# Patient Record
Sex: Female | Born: 1945
Health system: Southern US, Community
[De-identification: ages and names within clinical notes are randomized; demographics above are authoritative.]

## PROBLEM LIST (undated history)

## (undated) DIAGNOSIS — F209 Schizophrenia, unspecified: Secondary | ICD-10-CM

---

## 2014-03-16 DIAGNOSIS — M79604 Pain in right leg: Secondary | ICD-10-CM | POA: Diagnosis not present

## 2014-03-16 DIAGNOSIS — E1165 Type 2 diabetes mellitus with hyperglycemia: Secondary | ICD-10-CM | POA: Diagnosis not present

## 2014-03-16 DIAGNOSIS — D649 Anemia, unspecified: Secondary | ICD-10-CM | POA: Diagnosis not present

## 2014-03-16 DIAGNOSIS — R269 Unspecified abnormalities of gait and mobility: Secondary | ICD-10-CM | POA: Diagnosis not present

## 2014-03-21 DIAGNOSIS — F209 Schizophrenia, unspecified: Secondary | ICD-10-CM | POA: Diagnosis not present

## 2014-03-21 DIAGNOSIS — K219 Gastro-esophageal reflux disease without esophagitis: Secondary | ICD-10-CM | POA: Diagnosis not present

## 2014-03-21 DIAGNOSIS — E538 Deficiency of other specified B group vitamins: Secondary | ICD-10-CM | POA: Diagnosis not present

## 2014-03-21 DIAGNOSIS — Z9181 History of falling: Secondary | ICD-10-CM | POA: Diagnosis not present

## 2014-03-21 DIAGNOSIS — F0390 Unspecified dementia without behavioral disturbance: Secondary | ICD-10-CM | POA: Diagnosis not present

## 2014-03-21 DIAGNOSIS — M6281 Muscle weakness (generalized): Secondary | ICD-10-CM | POA: Diagnosis not present

## 2014-03-21 DIAGNOSIS — F4310 Post-traumatic stress disorder, unspecified: Secondary | ICD-10-CM | POA: Diagnosis not present

## 2014-03-21 DIAGNOSIS — J449 Chronic obstructive pulmonary disease, unspecified: Secondary | ICD-10-CM | POA: Diagnosis not present

## 2014-03-21 DIAGNOSIS — E119 Type 2 diabetes mellitus without complications: Secondary | ICD-10-CM | POA: Diagnosis not present

## 2014-03-22 DIAGNOSIS — F209 Schizophrenia, unspecified: Secondary | ICD-10-CM | POA: Diagnosis not present

## 2014-03-22 DIAGNOSIS — E119 Type 2 diabetes mellitus without complications: Secondary | ICD-10-CM | POA: Diagnosis not present

## 2014-03-22 DIAGNOSIS — J449 Chronic obstructive pulmonary disease, unspecified: Secondary | ICD-10-CM | POA: Diagnosis not present

## 2014-03-22 DIAGNOSIS — F4310 Post-traumatic stress disorder, unspecified: Secondary | ICD-10-CM | POA: Diagnosis not present

## 2014-03-22 DIAGNOSIS — F0390 Unspecified dementia without behavioral disturbance: Secondary | ICD-10-CM | POA: Diagnosis not present

## 2014-03-22 DIAGNOSIS — M6281 Muscle weakness (generalized): Secondary | ICD-10-CM | POA: Diagnosis not present

## 2014-03-26 DIAGNOSIS — F250 Schizoaffective disorder, bipolar type: Secondary | ICD-10-CM | POA: Diagnosis not present

## 2014-03-28 DIAGNOSIS — F0390 Unspecified dementia without behavioral disturbance: Secondary | ICD-10-CM | POA: Diagnosis not present

## 2014-03-28 DIAGNOSIS — F4310 Post-traumatic stress disorder, unspecified: Secondary | ICD-10-CM | POA: Diagnosis not present

## 2014-03-28 DIAGNOSIS — F209 Schizophrenia, unspecified: Secondary | ICD-10-CM | POA: Diagnosis not present

## 2014-03-28 DIAGNOSIS — J449 Chronic obstructive pulmonary disease, unspecified: Secondary | ICD-10-CM | POA: Diagnosis not present

## 2014-03-28 DIAGNOSIS — E119 Type 2 diabetes mellitus without complications: Secondary | ICD-10-CM | POA: Diagnosis not present

## 2014-03-28 DIAGNOSIS — M6281 Muscle weakness (generalized): Secondary | ICD-10-CM | POA: Diagnosis not present

## 2014-03-29 DIAGNOSIS — F209 Schizophrenia, unspecified: Secondary | ICD-10-CM | POA: Diagnosis not present

## 2014-03-29 DIAGNOSIS — J449 Chronic obstructive pulmonary disease, unspecified: Secondary | ICD-10-CM | POA: Diagnosis not present

## 2014-03-29 DIAGNOSIS — F4310 Post-traumatic stress disorder, unspecified: Secondary | ICD-10-CM | POA: Diagnosis not present

## 2014-03-29 DIAGNOSIS — E119 Type 2 diabetes mellitus without complications: Secondary | ICD-10-CM | POA: Diagnosis not present

## 2014-03-29 DIAGNOSIS — M6281 Muscle weakness (generalized): Secondary | ICD-10-CM | POA: Diagnosis not present

## 2014-03-29 DIAGNOSIS — F0390 Unspecified dementia without behavioral disturbance: Secondary | ICD-10-CM | POA: Diagnosis not present

## 2014-03-30 DIAGNOSIS — J449 Chronic obstructive pulmonary disease, unspecified: Secondary | ICD-10-CM | POA: Diagnosis not present

## 2014-03-30 DIAGNOSIS — E119 Type 2 diabetes mellitus without complications: Secondary | ICD-10-CM | POA: Diagnosis not present

## 2014-03-30 DIAGNOSIS — F4310 Post-traumatic stress disorder, unspecified: Secondary | ICD-10-CM | POA: Diagnosis not present

## 2014-03-30 DIAGNOSIS — F0390 Unspecified dementia without behavioral disturbance: Secondary | ICD-10-CM | POA: Diagnosis not present

## 2014-03-30 DIAGNOSIS — M6281 Muscle weakness (generalized): Secondary | ICD-10-CM | POA: Diagnosis not present

## 2014-03-30 DIAGNOSIS — F209 Schizophrenia, unspecified: Secondary | ICD-10-CM | POA: Diagnosis not present

## 2014-04-02 DIAGNOSIS — F209 Schizophrenia, unspecified: Secondary | ICD-10-CM | POA: Diagnosis not present

## 2014-04-02 DIAGNOSIS — F0390 Unspecified dementia without behavioral disturbance: Secondary | ICD-10-CM | POA: Diagnosis not present

## 2014-04-02 DIAGNOSIS — F4310 Post-traumatic stress disorder, unspecified: Secondary | ICD-10-CM | POA: Diagnosis not present

## 2014-04-02 DIAGNOSIS — E119 Type 2 diabetes mellitus without complications: Secondary | ICD-10-CM | POA: Diagnosis not present

## 2014-04-02 DIAGNOSIS — M6281 Muscle weakness (generalized): Secondary | ICD-10-CM | POA: Diagnosis not present

## 2014-04-02 DIAGNOSIS — J449 Chronic obstructive pulmonary disease, unspecified: Secondary | ICD-10-CM | POA: Diagnosis not present

## 2014-04-03 DIAGNOSIS — M6281 Muscle weakness (generalized): Secondary | ICD-10-CM | POA: Diagnosis not present

## 2014-04-03 DIAGNOSIS — F209 Schizophrenia, unspecified: Secondary | ICD-10-CM | POA: Diagnosis not present

## 2014-04-03 DIAGNOSIS — F4310 Post-traumatic stress disorder, unspecified: Secondary | ICD-10-CM | POA: Diagnosis not present

## 2014-04-03 DIAGNOSIS — J449 Chronic obstructive pulmonary disease, unspecified: Secondary | ICD-10-CM | POA: Diagnosis not present

## 2014-04-03 DIAGNOSIS — F0390 Unspecified dementia without behavioral disturbance: Secondary | ICD-10-CM | POA: Diagnosis not present

## 2014-04-03 DIAGNOSIS — E119 Type 2 diabetes mellitus without complications: Secondary | ICD-10-CM | POA: Diagnosis not present

## 2014-04-05 DIAGNOSIS — F4310 Post-traumatic stress disorder, unspecified: Secondary | ICD-10-CM | POA: Diagnosis not present

## 2014-04-05 DIAGNOSIS — F0390 Unspecified dementia without behavioral disturbance: Secondary | ICD-10-CM | POA: Diagnosis not present

## 2014-04-05 DIAGNOSIS — F209 Schizophrenia, unspecified: Secondary | ICD-10-CM | POA: Diagnosis not present

## 2014-04-05 DIAGNOSIS — M6281 Muscle weakness (generalized): Secondary | ICD-10-CM | POA: Diagnosis not present

## 2014-04-05 DIAGNOSIS — E119 Type 2 diabetes mellitus without complications: Secondary | ICD-10-CM | POA: Diagnosis not present

## 2014-04-05 DIAGNOSIS — J449 Chronic obstructive pulmonary disease, unspecified: Secondary | ICD-10-CM | POA: Diagnosis not present

## 2014-04-09 DIAGNOSIS — E119 Type 2 diabetes mellitus without complications: Secondary | ICD-10-CM | POA: Diagnosis not present

## 2014-04-09 DIAGNOSIS — M6281 Muscle weakness (generalized): Secondary | ICD-10-CM | POA: Diagnosis not present

## 2014-04-09 DIAGNOSIS — F0390 Unspecified dementia without behavioral disturbance: Secondary | ICD-10-CM | POA: Diagnosis not present

## 2014-04-09 DIAGNOSIS — F209 Schizophrenia, unspecified: Secondary | ICD-10-CM | POA: Diagnosis not present

## 2014-04-09 DIAGNOSIS — J449 Chronic obstructive pulmonary disease, unspecified: Secondary | ICD-10-CM | POA: Diagnosis not present

## 2014-04-09 DIAGNOSIS — F4310 Post-traumatic stress disorder, unspecified: Secondary | ICD-10-CM | POA: Diagnosis not present

## 2014-04-11 DIAGNOSIS — E119 Type 2 diabetes mellitus without complications: Secondary | ICD-10-CM | POA: Diagnosis not present

## 2014-04-11 DIAGNOSIS — J449 Chronic obstructive pulmonary disease, unspecified: Secondary | ICD-10-CM | POA: Diagnosis not present

## 2014-04-11 DIAGNOSIS — M6281 Muscle weakness (generalized): Secondary | ICD-10-CM | POA: Diagnosis not present

## 2014-04-11 DIAGNOSIS — F0390 Unspecified dementia without behavioral disturbance: Secondary | ICD-10-CM | POA: Diagnosis not present

## 2014-04-11 DIAGNOSIS — F4310 Post-traumatic stress disorder, unspecified: Secondary | ICD-10-CM | POA: Diagnosis not present

## 2014-04-11 DIAGNOSIS — F209 Schizophrenia, unspecified: Secondary | ICD-10-CM | POA: Diagnosis not present

## 2014-04-18 DIAGNOSIS — F0390 Unspecified dementia without behavioral disturbance: Secondary | ICD-10-CM | POA: Diagnosis not present

## 2014-04-18 DIAGNOSIS — M6281 Muscle weakness (generalized): Secondary | ICD-10-CM | POA: Diagnosis not present

## 2014-04-18 DIAGNOSIS — J449 Chronic obstructive pulmonary disease, unspecified: Secondary | ICD-10-CM | POA: Diagnosis not present

## 2014-04-18 DIAGNOSIS — F4310 Post-traumatic stress disorder, unspecified: Secondary | ICD-10-CM | POA: Diagnosis not present

## 2014-04-18 DIAGNOSIS — F209 Schizophrenia, unspecified: Secondary | ICD-10-CM | POA: Diagnosis not present

## 2014-04-18 DIAGNOSIS — E119 Type 2 diabetes mellitus without complications: Secondary | ICD-10-CM | POA: Diagnosis not present

## 2014-04-19 DIAGNOSIS — F4310 Post-traumatic stress disorder, unspecified: Secondary | ICD-10-CM | POA: Diagnosis not present

## 2014-04-19 DIAGNOSIS — E119 Type 2 diabetes mellitus without complications: Secondary | ICD-10-CM | POA: Diagnosis not present

## 2014-04-19 DIAGNOSIS — M6281 Muscle weakness (generalized): Secondary | ICD-10-CM | POA: Diagnosis not present

## 2014-04-19 DIAGNOSIS — F0390 Unspecified dementia without behavioral disturbance: Secondary | ICD-10-CM | POA: Diagnosis not present

## 2014-04-19 DIAGNOSIS — F209 Schizophrenia, unspecified: Secondary | ICD-10-CM | POA: Diagnosis not present

## 2014-04-19 DIAGNOSIS — J449 Chronic obstructive pulmonary disease, unspecified: Secondary | ICD-10-CM | POA: Diagnosis not present

## 2014-04-25 DIAGNOSIS — J449 Chronic obstructive pulmonary disease, unspecified: Secondary | ICD-10-CM | POA: Diagnosis not present

## 2014-04-25 DIAGNOSIS — E119 Type 2 diabetes mellitus without complications: Secondary | ICD-10-CM | POA: Diagnosis not present

## 2014-04-25 DIAGNOSIS — F4310 Post-traumatic stress disorder, unspecified: Secondary | ICD-10-CM | POA: Diagnosis not present

## 2014-04-25 DIAGNOSIS — M6281 Muscle weakness (generalized): Secondary | ICD-10-CM | POA: Diagnosis not present

## 2014-04-25 DIAGNOSIS — F0390 Unspecified dementia without behavioral disturbance: Secondary | ICD-10-CM | POA: Diagnosis not present

## 2014-04-25 DIAGNOSIS — F209 Schizophrenia, unspecified: Secondary | ICD-10-CM | POA: Diagnosis not present

## 2014-05-07 DIAGNOSIS — Z23 Encounter for immunization: Secondary | ICD-10-CM | POA: Diagnosis not present

## 2014-05-09 DIAGNOSIS — L609 Nail disorder, unspecified: Secondary | ICD-10-CM | POA: Diagnosis not present

## 2014-06-08 DIAGNOSIS — I10 Essential (primary) hypertension: Secondary | ICD-10-CM | POA: Diagnosis not present

## 2014-06-08 DIAGNOSIS — D649 Anemia, unspecified: Secondary | ICD-10-CM | POA: Diagnosis not present

## 2014-06-08 DIAGNOSIS — E1165 Type 2 diabetes mellitus with hyperglycemia: Secondary | ICD-10-CM | POA: Diagnosis not present

## 2014-06-08 DIAGNOSIS — K210 Gastro-esophageal reflux disease with esophagitis: Secondary | ICD-10-CM | POA: Diagnosis not present

## 2014-06-25 DIAGNOSIS — F250 Schizoaffective disorder, bipolar type: Secondary | ICD-10-CM | POA: Diagnosis not present

## 2014-06-28 DIAGNOSIS — F319 Bipolar disorder, unspecified: Secondary | ICD-10-CM | POA: Diagnosis not present

## 2014-06-28 DIAGNOSIS — F209 Schizophrenia, unspecified: Secondary | ICD-10-CM | POA: Diagnosis not present

## 2014-06-28 DIAGNOSIS — M6281 Muscle weakness (generalized): Secondary | ICD-10-CM | POA: Diagnosis not present

## 2014-06-28 DIAGNOSIS — Z79899 Other long term (current) drug therapy: Secondary | ICD-10-CM | POA: Diagnosis not present

## 2014-06-28 DIAGNOSIS — I10 Essential (primary) hypertension: Secondary | ICD-10-CM | POA: Diagnosis not present

## 2014-06-28 DIAGNOSIS — N390 Urinary tract infection, site not specified: Secondary | ICD-10-CM | POA: Diagnosis not present

## 2014-06-28 DIAGNOSIS — J449 Chronic obstructive pulmonary disease, unspecified: Secondary | ICD-10-CM | POA: Diagnosis not present

## 2014-06-28 DIAGNOSIS — Z9181 History of falling: Secondary | ICD-10-CM | POA: Diagnosis not present

## 2014-06-28 DIAGNOSIS — E119 Type 2 diabetes mellitus without complications: Secondary | ICD-10-CM | POA: Diagnosis not present

## 2014-06-28 DIAGNOSIS — F0390 Unspecified dementia without behavioral disturbance: Secondary | ICD-10-CM | POA: Diagnosis not present

## 2014-06-28 DIAGNOSIS — R269 Unspecified abnormalities of gait and mobility: Secondary | ICD-10-CM | POA: Diagnosis not present

## 2014-06-28 DIAGNOSIS — F4310 Post-traumatic stress disorder, unspecified: Secondary | ICD-10-CM | POA: Diagnosis not present

## 2014-07-02 DIAGNOSIS — F209 Schizophrenia, unspecified: Secondary | ICD-10-CM | POA: Diagnosis not present

## 2014-07-02 DIAGNOSIS — F319 Bipolar disorder, unspecified: Secondary | ICD-10-CM | POA: Diagnosis not present

## 2014-07-02 DIAGNOSIS — M6281 Muscle weakness (generalized): Secondary | ICD-10-CM | POA: Diagnosis not present

## 2014-07-02 DIAGNOSIS — F4310 Post-traumatic stress disorder, unspecified: Secondary | ICD-10-CM | POA: Diagnosis not present

## 2014-07-02 DIAGNOSIS — I10 Essential (primary) hypertension: Secondary | ICD-10-CM | POA: Diagnosis not present

## 2014-07-02 DIAGNOSIS — E119 Type 2 diabetes mellitus without complications: Secondary | ICD-10-CM | POA: Diagnosis not present

## 2014-07-03 DIAGNOSIS — E119 Type 2 diabetes mellitus without complications: Secondary | ICD-10-CM | POA: Diagnosis not present

## 2014-07-03 DIAGNOSIS — I10 Essential (primary) hypertension: Secondary | ICD-10-CM | POA: Diagnosis not present

## 2014-07-03 DIAGNOSIS — M6281 Muscle weakness (generalized): Secondary | ICD-10-CM | POA: Diagnosis not present

## 2014-07-03 DIAGNOSIS — F319 Bipolar disorder, unspecified: Secondary | ICD-10-CM | POA: Diagnosis not present

## 2014-07-03 DIAGNOSIS — F209 Schizophrenia, unspecified: Secondary | ICD-10-CM | POA: Diagnosis not present

## 2014-07-03 DIAGNOSIS — F4310 Post-traumatic stress disorder, unspecified: Secondary | ICD-10-CM | POA: Diagnosis not present

## 2014-07-04 DIAGNOSIS — E119 Type 2 diabetes mellitus without complications: Secondary | ICD-10-CM | POA: Diagnosis not present

## 2014-07-04 DIAGNOSIS — F4310 Post-traumatic stress disorder, unspecified: Secondary | ICD-10-CM | POA: Diagnosis not present

## 2014-07-04 DIAGNOSIS — F209 Schizophrenia, unspecified: Secondary | ICD-10-CM | POA: Diagnosis not present

## 2014-07-04 DIAGNOSIS — M6281 Muscle weakness (generalized): Secondary | ICD-10-CM | POA: Diagnosis not present

## 2014-07-04 DIAGNOSIS — F319 Bipolar disorder, unspecified: Secondary | ICD-10-CM | POA: Diagnosis not present

## 2014-07-04 DIAGNOSIS — I10 Essential (primary) hypertension: Secondary | ICD-10-CM | POA: Diagnosis not present

## 2014-07-07 DIAGNOSIS — F209 Schizophrenia, unspecified: Secondary | ICD-10-CM | POA: Diagnosis not present

## 2014-07-07 DIAGNOSIS — F319 Bipolar disorder, unspecified: Secondary | ICD-10-CM | POA: Diagnosis not present

## 2014-07-07 DIAGNOSIS — I10 Essential (primary) hypertension: Secondary | ICD-10-CM | POA: Diagnosis not present

## 2014-07-07 DIAGNOSIS — F4310 Post-traumatic stress disorder, unspecified: Secondary | ICD-10-CM | POA: Diagnosis not present

## 2014-07-07 DIAGNOSIS — M6281 Muscle weakness (generalized): Secondary | ICD-10-CM | POA: Diagnosis not present

## 2014-07-07 DIAGNOSIS — E119 Type 2 diabetes mellitus without complications: Secondary | ICD-10-CM | POA: Diagnosis not present

## 2014-07-10 DIAGNOSIS — F319 Bipolar disorder, unspecified: Secondary | ICD-10-CM | POA: Diagnosis not present

## 2014-07-10 DIAGNOSIS — M6281 Muscle weakness (generalized): Secondary | ICD-10-CM | POA: Diagnosis not present

## 2014-07-10 DIAGNOSIS — F209 Schizophrenia, unspecified: Secondary | ICD-10-CM | POA: Diagnosis not present

## 2014-07-10 DIAGNOSIS — E119 Type 2 diabetes mellitus without complications: Secondary | ICD-10-CM | POA: Diagnosis not present

## 2014-07-10 DIAGNOSIS — F4310 Post-traumatic stress disorder, unspecified: Secondary | ICD-10-CM | POA: Diagnosis not present

## 2014-07-10 DIAGNOSIS — I10 Essential (primary) hypertension: Secondary | ICD-10-CM | POA: Diagnosis not present

## 2014-07-11 DIAGNOSIS — I10 Essential (primary) hypertension: Secondary | ICD-10-CM | POA: Diagnosis not present

## 2014-07-11 DIAGNOSIS — F4310 Post-traumatic stress disorder, unspecified: Secondary | ICD-10-CM | POA: Diagnosis not present

## 2014-07-11 DIAGNOSIS — M6281 Muscle weakness (generalized): Secondary | ICD-10-CM | POA: Diagnosis not present

## 2014-07-11 DIAGNOSIS — F319 Bipolar disorder, unspecified: Secondary | ICD-10-CM | POA: Diagnosis not present

## 2014-07-11 DIAGNOSIS — E119 Type 2 diabetes mellitus without complications: Secondary | ICD-10-CM | POA: Diagnosis not present

## 2014-07-11 DIAGNOSIS — F209 Schizophrenia, unspecified: Secondary | ICD-10-CM | POA: Diagnosis not present

## 2014-07-12 DIAGNOSIS — I10 Essential (primary) hypertension: Secondary | ICD-10-CM | POA: Diagnosis not present

## 2014-07-12 DIAGNOSIS — F209 Schizophrenia, unspecified: Secondary | ICD-10-CM | POA: Diagnosis not present

## 2014-07-12 DIAGNOSIS — F4310 Post-traumatic stress disorder, unspecified: Secondary | ICD-10-CM | POA: Diagnosis not present

## 2014-07-12 DIAGNOSIS — M6281 Muscle weakness (generalized): Secondary | ICD-10-CM | POA: Diagnosis not present

## 2014-07-12 DIAGNOSIS — F319 Bipolar disorder, unspecified: Secondary | ICD-10-CM | POA: Diagnosis not present

## 2014-07-12 DIAGNOSIS — E119 Type 2 diabetes mellitus without complications: Secondary | ICD-10-CM | POA: Diagnosis not present

## 2014-07-13 DIAGNOSIS — F209 Schizophrenia, unspecified: Secondary | ICD-10-CM | POA: Diagnosis not present

## 2014-07-13 DIAGNOSIS — E119 Type 2 diabetes mellitus without complications: Secondary | ICD-10-CM | POA: Diagnosis not present

## 2014-07-13 DIAGNOSIS — F4310 Post-traumatic stress disorder, unspecified: Secondary | ICD-10-CM | POA: Diagnosis not present

## 2014-07-13 DIAGNOSIS — I10 Essential (primary) hypertension: Secondary | ICD-10-CM | POA: Diagnosis not present

## 2014-07-13 DIAGNOSIS — M6281 Muscle weakness (generalized): Secondary | ICD-10-CM | POA: Diagnosis not present

## 2014-07-13 DIAGNOSIS — F319 Bipolar disorder, unspecified: Secondary | ICD-10-CM | POA: Diagnosis not present

## 2014-07-16 DIAGNOSIS — M6281 Muscle weakness (generalized): Secondary | ICD-10-CM | POA: Diagnosis not present

## 2014-07-16 DIAGNOSIS — F209 Schizophrenia, unspecified: Secondary | ICD-10-CM | POA: Diagnosis not present

## 2014-07-16 DIAGNOSIS — F319 Bipolar disorder, unspecified: Secondary | ICD-10-CM | POA: Diagnosis not present

## 2014-07-16 DIAGNOSIS — E119 Type 2 diabetes mellitus without complications: Secondary | ICD-10-CM | POA: Diagnosis not present

## 2014-07-16 DIAGNOSIS — F4310 Post-traumatic stress disorder, unspecified: Secondary | ICD-10-CM | POA: Diagnosis not present

## 2014-07-16 DIAGNOSIS — I10 Essential (primary) hypertension: Secondary | ICD-10-CM | POA: Diagnosis not present

## 2014-07-17 DIAGNOSIS — M6281 Muscle weakness (generalized): Secondary | ICD-10-CM | POA: Diagnosis not present

## 2014-07-17 DIAGNOSIS — I10 Essential (primary) hypertension: Secondary | ICD-10-CM | POA: Diagnosis not present

## 2014-07-17 DIAGNOSIS — F4310 Post-traumatic stress disorder, unspecified: Secondary | ICD-10-CM | POA: Diagnosis not present

## 2014-07-17 DIAGNOSIS — F209 Schizophrenia, unspecified: Secondary | ICD-10-CM | POA: Diagnosis not present

## 2014-07-17 DIAGNOSIS — F319 Bipolar disorder, unspecified: Secondary | ICD-10-CM | POA: Diagnosis not present

## 2014-07-17 DIAGNOSIS — E119 Type 2 diabetes mellitus without complications: Secondary | ICD-10-CM | POA: Diagnosis not present

## 2014-07-18 DIAGNOSIS — F4310 Post-traumatic stress disorder, unspecified: Secondary | ICD-10-CM | POA: Diagnosis not present

## 2014-07-18 DIAGNOSIS — M6281 Muscle weakness (generalized): Secondary | ICD-10-CM | POA: Diagnosis not present

## 2014-07-18 DIAGNOSIS — E119 Type 2 diabetes mellitus without complications: Secondary | ICD-10-CM | POA: Diagnosis not present

## 2014-07-18 DIAGNOSIS — F209 Schizophrenia, unspecified: Secondary | ICD-10-CM | POA: Diagnosis not present

## 2014-07-18 DIAGNOSIS — F319 Bipolar disorder, unspecified: Secondary | ICD-10-CM | POA: Diagnosis not present

## 2014-07-18 DIAGNOSIS — I10 Essential (primary) hypertension: Secondary | ICD-10-CM | POA: Diagnosis not present

## 2014-07-23 DIAGNOSIS — M6281 Muscle weakness (generalized): Secondary | ICD-10-CM | POA: Diagnosis not present

## 2014-07-23 DIAGNOSIS — I10 Essential (primary) hypertension: Secondary | ICD-10-CM | POA: Diagnosis not present

## 2014-07-23 DIAGNOSIS — F319 Bipolar disorder, unspecified: Secondary | ICD-10-CM | POA: Diagnosis not present

## 2014-07-23 DIAGNOSIS — E119 Type 2 diabetes mellitus without complications: Secondary | ICD-10-CM | POA: Diagnosis not present

## 2014-07-23 DIAGNOSIS — F4310 Post-traumatic stress disorder, unspecified: Secondary | ICD-10-CM | POA: Diagnosis not present

## 2014-07-23 DIAGNOSIS — F209 Schizophrenia, unspecified: Secondary | ICD-10-CM | POA: Diagnosis not present

## 2014-07-24 DIAGNOSIS — F4310 Post-traumatic stress disorder, unspecified: Secondary | ICD-10-CM | POA: Diagnosis not present

## 2014-07-24 DIAGNOSIS — I10 Essential (primary) hypertension: Secondary | ICD-10-CM | POA: Diagnosis not present

## 2014-07-24 DIAGNOSIS — M6281 Muscle weakness (generalized): Secondary | ICD-10-CM | POA: Diagnosis not present

## 2014-07-24 DIAGNOSIS — F319 Bipolar disorder, unspecified: Secondary | ICD-10-CM | POA: Diagnosis not present

## 2014-07-24 DIAGNOSIS — F209 Schizophrenia, unspecified: Secondary | ICD-10-CM | POA: Diagnosis not present

## 2014-07-24 DIAGNOSIS — E119 Type 2 diabetes mellitus without complications: Secondary | ICD-10-CM | POA: Diagnosis not present

## 2014-07-25 DIAGNOSIS — M6281 Muscle weakness (generalized): Secondary | ICD-10-CM | POA: Diagnosis not present

## 2014-07-25 DIAGNOSIS — F319 Bipolar disorder, unspecified: Secondary | ICD-10-CM | POA: Diagnosis not present

## 2014-07-25 DIAGNOSIS — F4310 Post-traumatic stress disorder, unspecified: Secondary | ICD-10-CM | POA: Diagnosis not present

## 2014-07-25 DIAGNOSIS — F209 Schizophrenia, unspecified: Secondary | ICD-10-CM | POA: Diagnosis not present

## 2014-07-25 DIAGNOSIS — E119 Type 2 diabetes mellitus without complications: Secondary | ICD-10-CM | POA: Diagnosis not present

## 2014-07-25 DIAGNOSIS — I10 Essential (primary) hypertension: Secondary | ICD-10-CM | POA: Diagnosis not present

## 2014-07-31 DIAGNOSIS — F4310 Post-traumatic stress disorder, unspecified: Secondary | ICD-10-CM | POA: Diagnosis not present

## 2014-07-31 DIAGNOSIS — F209 Schizophrenia, unspecified: Secondary | ICD-10-CM | POA: Diagnosis not present

## 2014-07-31 DIAGNOSIS — I10 Essential (primary) hypertension: Secondary | ICD-10-CM | POA: Diagnosis not present

## 2014-07-31 DIAGNOSIS — E119 Type 2 diabetes mellitus without complications: Secondary | ICD-10-CM | POA: Diagnosis not present

## 2014-07-31 DIAGNOSIS — M6281 Muscle weakness (generalized): Secondary | ICD-10-CM | POA: Diagnosis not present

## 2014-07-31 DIAGNOSIS — F319 Bipolar disorder, unspecified: Secondary | ICD-10-CM | POA: Diagnosis not present

## 2014-08-24 DIAGNOSIS — E119 Type 2 diabetes mellitus without complications: Secondary | ICD-10-CM | POA: Diagnosis not present

## 2014-08-24 DIAGNOSIS — F209 Schizophrenia, unspecified: Secondary | ICD-10-CM | POA: Diagnosis not present

## 2014-08-24 DIAGNOSIS — I10 Essential (primary) hypertension: Secondary | ICD-10-CM | POA: Diagnosis not present

## 2014-08-24 DIAGNOSIS — M6281 Muscle weakness (generalized): Secondary | ICD-10-CM | POA: Diagnosis not present

## 2014-09-24 DIAGNOSIS — E039 Hypothyroidism, unspecified: Secondary | ICD-10-CM | POA: Diagnosis not present

## 2014-09-24 DIAGNOSIS — I10 Essential (primary) hypertension: Secondary | ICD-10-CM | POA: Diagnosis not present

## 2014-09-24 DIAGNOSIS — E785 Hyperlipidemia, unspecified: Secondary | ICD-10-CM | POA: Diagnosis not present

## 2014-09-24 DIAGNOSIS — E119 Type 2 diabetes mellitus without complications: Secondary | ICD-10-CM | POA: Diagnosis not present

## 2014-09-25 DIAGNOSIS — F250 Schizoaffective disorder, bipolar type: Secondary | ICD-10-CM | POA: Diagnosis not present

## 2014-10-05 DIAGNOSIS — I10 Essential (primary) hypertension: Secondary | ICD-10-CM | POA: Diagnosis not present

## 2014-10-05 DIAGNOSIS — F0280 Dementia in other diseases classified elsewhere without behavioral disturbance: Secondary | ICD-10-CM | POA: Diagnosis not present

## 2014-10-05 DIAGNOSIS — D649 Anemia, unspecified: Secondary | ICD-10-CM | POA: Diagnosis not present

## 2014-10-05 DIAGNOSIS — G309 Alzheimer's disease, unspecified: Secondary | ICD-10-CM | POA: Diagnosis not present

## 2014-10-05 DIAGNOSIS — Z9181 History of falling: Secondary | ICD-10-CM | POA: Diagnosis not present

## 2014-10-05 DIAGNOSIS — F209 Schizophrenia, unspecified: Secondary | ICD-10-CM | POA: Diagnosis not present

## 2014-10-05 DIAGNOSIS — E1142 Type 2 diabetes mellitus with diabetic polyneuropathy: Secondary | ICD-10-CM | POA: Diagnosis not present

## 2014-10-10 DIAGNOSIS — D649 Anemia, unspecified: Secondary | ICD-10-CM | POA: Diagnosis not present

## 2014-10-10 DIAGNOSIS — F209 Schizophrenia, unspecified: Secondary | ICD-10-CM | POA: Diagnosis not present

## 2014-10-10 DIAGNOSIS — I10 Essential (primary) hypertension: Secondary | ICD-10-CM | POA: Diagnosis not present

## 2014-10-10 DIAGNOSIS — F0280 Dementia in other diseases classified elsewhere without behavioral disturbance: Secondary | ICD-10-CM | POA: Diagnosis not present

## 2014-10-10 DIAGNOSIS — E1142 Type 2 diabetes mellitus with diabetic polyneuropathy: Secondary | ICD-10-CM | POA: Diagnosis not present

## 2014-10-10 DIAGNOSIS — G309 Alzheimer's disease, unspecified: Secondary | ICD-10-CM | POA: Diagnosis not present

## 2014-10-11 DIAGNOSIS — I10 Essential (primary) hypertension: Secondary | ICD-10-CM | POA: Diagnosis not present

## 2014-10-11 DIAGNOSIS — F0280 Dementia in other diseases classified elsewhere without behavioral disturbance: Secondary | ICD-10-CM | POA: Diagnosis not present

## 2014-10-11 DIAGNOSIS — E1142 Type 2 diabetes mellitus with diabetic polyneuropathy: Secondary | ICD-10-CM | POA: Diagnosis not present

## 2014-10-11 DIAGNOSIS — G309 Alzheimer's disease, unspecified: Secondary | ICD-10-CM | POA: Diagnosis not present

## 2014-10-11 DIAGNOSIS — F209 Schizophrenia, unspecified: Secondary | ICD-10-CM | POA: Diagnosis not present

## 2014-10-11 DIAGNOSIS — D649 Anemia, unspecified: Secondary | ICD-10-CM | POA: Diagnosis not present

## 2014-10-16 DIAGNOSIS — D649 Anemia, unspecified: Secondary | ICD-10-CM | POA: Diagnosis not present

## 2014-10-16 DIAGNOSIS — I10 Essential (primary) hypertension: Secondary | ICD-10-CM | POA: Diagnosis not present

## 2014-10-16 DIAGNOSIS — E1142 Type 2 diabetes mellitus with diabetic polyneuropathy: Secondary | ICD-10-CM | POA: Diagnosis not present

## 2014-10-16 DIAGNOSIS — G309 Alzheimer's disease, unspecified: Secondary | ICD-10-CM | POA: Diagnosis not present

## 2014-10-16 DIAGNOSIS — F0280 Dementia in other diseases classified elsewhere without behavioral disturbance: Secondary | ICD-10-CM | POA: Diagnosis not present

## 2014-10-16 DIAGNOSIS — F209 Schizophrenia, unspecified: Secondary | ICD-10-CM | POA: Diagnosis not present

## 2014-10-18 DIAGNOSIS — F209 Schizophrenia, unspecified: Secondary | ICD-10-CM | POA: Diagnosis not present

## 2014-10-18 DIAGNOSIS — G309 Alzheimer's disease, unspecified: Secondary | ICD-10-CM | POA: Diagnosis not present

## 2014-10-18 DIAGNOSIS — D649 Anemia, unspecified: Secondary | ICD-10-CM | POA: Diagnosis not present

## 2014-10-18 DIAGNOSIS — E1142 Type 2 diabetes mellitus with diabetic polyneuropathy: Secondary | ICD-10-CM | POA: Diagnosis not present

## 2014-10-18 DIAGNOSIS — F0280 Dementia in other diseases classified elsewhere without behavioral disturbance: Secondary | ICD-10-CM | POA: Diagnosis not present

## 2014-10-18 DIAGNOSIS — I10 Essential (primary) hypertension: Secondary | ICD-10-CM | POA: Diagnosis not present

## 2014-10-22 DIAGNOSIS — F0280 Dementia in other diseases classified elsewhere without behavioral disturbance: Secondary | ICD-10-CM | POA: Diagnosis not present

## 2014-10-22 DIAGNOSIS — E1142 Type 2 diabetes mellitus with diabetic polyneuropathy: Secondary | ICD-10-CM | POA: Diagnosis not present

## 2014-10-22 DIAGNOSIS — F209 Schizophrenia, unspecified: Secondary | ICD-10-CM | POA: Diagnosis not present

## 2014-10-22 DIAGNOSIS — D649 Anemia, unspecified: Secondary | ICD-10-CM | POA: Diagnosis not present

## 2014-10-22 DIAGNOSIS — G309 Alzheimer's disease, unspecified: Secondary | ICD-10-CM | POA: Diagnosis not present

## 2014-10-22 DIAGNOSIS — I10 Essential (primary) hypertension: Secondary | ICD-10-CM | POA: Diagnosis not present

## 2014-11-02 DIAGNOSIS — F209 Schizophrenia, unspecified: Secondary | ICD-10-CM | POA: Diagnosis not present

## 2014-11-02 DIAGNOSIS — F0280 Dementia in other diseases classified elsewhere without behavioral disturbance: Secondary | ICD-10-CM | POA: Diagnosis not present

## 2014-11-02 DIAGNOSIS — G309 Alzheimer's disease, unspecified: Secondary | ICD-10-CM | POA: Diagnosis not present

## 2014-11-02 DIAGNOSIS — I10 Essential (primary) hypertension: Secondary | ICD-10-CM | POA: Diagnosis not present

## 2014-11-02 DIAGNOSIS — D649 Anemia, unspecified: Secondary | ICD-10-CM | POA: Diagnosis not present

## 2014-11-02 DIAGNOSIS — E1142 Type 2 diabetes mellitus with diabetic polyneuropathy: Secondary | ICD-10-CM | POA: Diagnosis not present

## 2014-11-08 DIAGNOSIS — F209 Schizophrenia, unspecified: Secondary | ICD-10-CM | POA: Diagnosis not present

## 2014-11-08 DIAGNOSIS — G309 Alzheimer's disease, unspecified: Secondary | ICD-10-CM | POA: Diagnosis not present

## 2014-11-08 DIAGNOSIS — F0280 Dementia in other diseases classified elsewhere without behavioral disturbance: Secondary | ICD-10-CM | POA: Diagnosis not present

## 2014-11-08 DIAGNOSIS — I10 Essential (primary) hypertension: Secondary | ICD-10-CM | POA: Diagnosis not present

## 2014-11-08 DIAGNOSIS — E1142 Type 2 diabetes mellitus with diabetic polyneuropathy: Secondary | ICD-10-CM | POA: Diagnosis not present

## 2014-11-08 DIAGNOSIS — D649 Anemia, unspecified: Secondary | ICD-10-CM | POA: Diagnosis not present

## 2014-11-13 DIAGNOSIS — E1142 Type 2 diabetes mellitus with diabetic polyneuropathy: Secondary | ICD-10-CM | POA: Diagnosis not present

## 2014-11-13 DIAGNOSIS — F209 Schizophrenia, unspecified: Secondary | ICD-10-CM | POA: Diagnosis not present

## 2014-11-13 DIAGNOSIS — G309 Alzheimer's disease, unspecified: Secondary | ICD-10-CM | POA: Diagnosis not present

## 2014-11-13 DIAGNOSIS — I10 Essential (primary) hypertension: Secondary | ICD-10-CM | POA: Diagnosis not present

## 2014-11-13 DIAGNOSIS — F0280 Dementia in other diseases classified elsewhere without behavioral disturbance: Secondary | ICD-10-CM | POA: Diagnosis not present

## 2014-11-13 DIAGNOSIS — D649 Anemia, unspecified: Secondary | ICD-10-CM | POA: Diagnosis not present

## 2014-11-22 DIAGNOSIS — F0280 Dementia in other diseases classified elsewhere without behavioral disturbance: Secondary | ICD-10-CM | POA: Diagnosis not present

## 2014-11-22 DIAGNOSIS — D649 Anemia, unspecified: Secondary | ICD-10-CM | POA: Diagnosis not present

## 2014-11-22 DIAGNOSIS — E039 Hypothyroidism, unspecified: Secondary | ICD-10-CM | POA: Diagnosis not present

## 2014-11-22 DIAGNOSIS — G309 Alzheimer's disease, unspecified: Secondary | ICD-10-CM | POA: Diagnosis not present

## 2014-11-22 DIAGNOSIS — E789 Disorder of lipoprotein metabolism, unspecified: Secondary | ICD-10-CM | POA: Diagnosis not present

## 2014-11-22 DIAGNOSIS — E1142 Type 2 diabetes mellitus with diabetic polyneuropathy: Secondary | ICD-10-CM | POA: Diagnosis not present

## 2014-11-22 DIAGNOSIS — I10 Essential (primary) hypertension: Secondary | ICD-10-CM | POA: Diagnosis not present

## 2014-11-22 DIAGNOSIS — F209 Schizophrenia, unspecified: Secondary | ICD-10-CM | POA: Diagnosis not present

## 2014-11-23 DIAGNOSIS — Z23 Encounter for immunization: Secondary | ICD-10-CM | POA: Diagnosis not present

## 2014-11-27 DIAGNOSIS — G309 Alzheimer's disease, unspecified: Secondary | ICD-10-CM | POA: Diagnosis not present

## 2014-11-27 DIAGNOSIS — I10 Essential (primary) hypertension: Secondary | ICD-10-CM | POA: Diagnosis not present

## 2014-11-27 DIAGNOSIS — E1142 Type 2 diabetes mellitus with diabetic polyneuropathy: Secondary | ICD-10-CM | POA: Diagnosis not present

## 2014-11-27 DIAGNOSIS — F0280 Dementia in other diseases classified elsewhere without behavioral disturbance: Secondary | ICD-10-CM | POA: Diagnosis not present

## 2014-11-27 DIAGNOSIS — F209 Schizophrenia, unspecified: Secondary | ICD-10-CM | POA: Diagnosis not present

## 2014-11-27 DIAGNOSIS — D649 Anemia, unspecified: Secondary | ICD-10-CM | POA: Diagnosis not present

## 2014-12-21 DIAGNOSIS — F250 Schizoaffective disorder, bipolar type: Secondary | ICD-10-CM | POA: Diagnosis not present

## 2015-01-30 DIAGNOSIS — F0390 Unspecified dementia without behavioral disturbance: Secondary | ICD-10-CM | POA: Diagnosis not present

## 2015-01-30 DIAGNOSIS — G461 Anterior cerebral artery syndrome: Secondary | ICD-10-CM | POA: Diagnosis not present

## 2015-01-30 DIAGNOSIS — G4701 Insomnia due to medical condition: Secondary | ICD-10-CM | POA: Diagnosis not present

## 2015-01-30 DIAGNOSIS — F419 Anxiety disorder, unspecified: Secondary | ICD-10-CM | POA: Diagnosis not present

## 2015-01-30 DIAGNOSIS — I10 Essential (primary) hypertension: Secondary | ICD-10-CM | POA: Diagnosis not present

## 2015-01-30 DIAGNOSIS — F209 Schizophrenia, unspecified: Secondary | ICD-10-CM | POA: Diagnosis not present

## 2015-01-30 DIAGNOSIS — Z0000 Encounter for general adult medical examination without abnormal findings: Secondary | ICD-10-CM | POA: Diagnosis not present

## 2015-01-30 DIAGNOSIS — E119 Type 2 diabetes mellitus without complications: Secondary | ICD-10-CM | POA: Diagnosis not present

## 2015-03-22 DIAGNOSIS — F250 Schizoaffective disorder, bipolar type: Secondary | ICD-10-CM | POA: Diagnosis not present

## 2015-04-15 DIAGNOSIS — Z9181 History of falling: Secondary | ICD-10-CM | POA: Diagnosis not present

## 2015-04-15 DIAGNOSIS — R269 Unspecified abnormalities of gait and mobility: Secondary | ICD-10-CM | POA: Diagnosis not present

## 2015-04-15 DIAGNOSIS — E538 Deficiency of other specified B group vitamins: Secondary | ICD-10-CM | POA: Diagnosis not present

## 2015-04-15 DIAGNOSIS — F209 Schizophrenia, unspecified: Secondary | ICD-10-CM | POA: Diagnosis not present

## 2015-04-15 DIAGNOSIS — Z79899 Other long term (current) drug therapy: Secondary | ICD-10-CM | POA: Diagnosis not present

## 2015-04-15 DIAGNOSIS — E119 Type 2 diabetes mellitus without complications: Secondary | ICD-10-CM | POA: Diagnosis not present

## 2015-04-15 DIAGNOSIS — E039 Hypothyroidism, unspecified: Secondary | ICD-10-CM | POA: Diagnosis not present

## 2015-04-15 DIAGNOSIS — D509 Iron deficiency anemia, unspecified: Secondary | ICD-10-CM | POA: Diagnosis not present

## 2015-04-15 DIAGNOSIS — Z1389 Encounter for screening for other disorder: Secondary | ICD-10-CM | POA: Diagnosis not present

## 2015-04-15 DIAGNOSIS — Z6820 Body mass index (BMI) 20.0-20.9, adult: Secondary | ICD-10-CM | POA: Diagnosis not present

## 2015-04-15 DIAGNOSIS — F4310 Post-traumatic stress disorder, unspecified: Secondary | ICD-10-CM | POA: Diagnosis not present

## 2015-05-06 DIAGNOSIS — L609 Nail disorder, unspecified: Secondary | ICD-10-CM | POA: Diagnosis not present

## 2015-06-21 DIAGNOSIS — F250 Schizoaffective disorder, bipolar type: Secondary | ICD-10-CM | POA: Diagnosis not present

## 2015-07-18 DIAGNOSIS — I10 Essential (primary) hypertension: Secondary | ICD-10-CM | POA: Diagnosis not present

## 2015-07-18 DIAGNOSIS — E782 Mixed hyperlipidemia: Secondary | ICD-10-CM | POA: Diagnosis not present

## 2015-07-18 DIAGNOSIS — N183 Chronic kidney disease, stage 3 (moderate): Secondary | ICD-10-CM | POA: Diagnosis not present

## 2015-07-18 DIAGNOSIS — E039 Hypothyroidism, unspecified: Secondary | ICD-10-CM | POA: Diagnosis not present

## 2015-07-18 DIAGNOSIS — Z1211 Encounter for screening for malignant neoplasm of colon: Secondary | ICD-10-CM | POA: Diagnosis not present

## 2015-07-18 DIAGNOSIS — E119 Type 2 diabetes mellitus without complications: Secondary | ICD-10-CM | POA: Diagnosis not present

## 2015-07-18 DIAGNOSIS — R251 Tremor, unspecified: Secondary | ICD-10-CM | POA: Diagnosis not present

## 2015-07-18 DIAGNOSIS — Z1231 Encounter for screening mammogram for malignant neoplasm of breast: Secondary | ICD-10-CM | POA: Diagnosis not present

## 2015-07-18 DIAGNOSIS — R531 Weakness: Secondary | ICD-10-CM | POA: Diagnosis not present

## 2015-07-23 DIAGNOSIS — Z1231 Encounter for screening mammogram for malignant neoplasm of breast: Secondary | ICD-10-CM | POA: Diagnosis not present

## 2015-08-12 DIAGNOSIS — R921 Mammographic calcification found on diagnostic imaging of breast: Secondary | ICD-10-CM | POA: Diagnosis not present

## 2015-08-12 DIAGNOSIS — R928 Other abnormal and inconclusive findings on diagnostic imaging of breast: Secondary | ICD-10-CM | POA: Diagnosis not present

## 2015-09-17 DIAGNOSIS — K649 Unspecified hemorrhoids: Secondary | ICD-10-CM | POA: Diagnosis not present

## 2015-09-17 DIAGNOSIS — F0390 Unspecified dementia without behavioral disturbance: Secondary | ICD-10-CM | POA: Diagnosis not present

## 2015-09-17 DIAGNOSIS — E559 Vitamin D deficiency, unspecified: Secondary | ICD-10-CM | POA: Diagnosis not present

## 2015-09-17 DIAGNOSIS — I10 Essential (primary) hypertension: Secondary | ICD-10-CM | POA: Diagnosis not present

## 2015-09-17 DIAGNOSIS — Z6820 Body mass index (BMI) 20.0-20.9, adult: Secondary | ICD-10-CM | POA: Diagnosis not present

## 2015-10-04 DIAGNOSIS — F250 Schizoaffective disorder, bipolar type: Secondary | ICD-10-CM | POA: Diagnosis not present

## 2015-12-25 DIAGNOSIS — Z23 Encounter for immunization: Secondary | ICD-10-CM | POA: Diagnosis not present

## 2016-02-03 DIAGNOSIS — F250 Schizoaffective disorder, bipolar type: Secondary | ICD-10-CM | POA: Diagnosis not present

## 2016-04-17 DIAGNOSIS — E039 Hypothyroidism, unspecified: Secondary | ICD-10-CM | POA: Diagnosis not present

## 2016-04-17 DIAGNOSIS — E559 Vitamin D deficiency, unspecified: Secondary | ICD-10-CM | POA: Diagnosis not present

## 2016-04-17 DIAGNOSIS — Z6824 Body mass index (BMI) 24.0-24.9, adult: Secondary | ICD-10-CM | POA: Diagnosis not present

## 2016-04-17 DIAGNOSIS — Z1389 Encounter for screening for other disorder: Secondary | ICD-10-CM | POA: Diagnosis not present

## 2016-04-17 DIAGNOSIS — E119 Type 2 diabetes mellitus without complications: Secondary | ICD-10-CM | POA: Diagnosis not present

## 2016-04-17 DIAGNOSIS — F0390 Unspecified dementia without behavioral disturbance: Secondary | ICD-10-CM | POA: Diagnosis not present

## 2016-04-17 DIAGNOSIS — Z9181 History of falling: Secondary | ICD-10-CM | POA: Diagnosis not present

## 2016-04-17 DIAGNOSIS — E538 Deficiency of other specified B group vitamins: Secondary | ICD-10-CM | POA: Diagnosis not present

## 2016-04-17 DIAGNOSIS — D509 Iron deficiency anemia, unspecified: Secondary | ICD-10-CM | POA: Diagnosis not present

## 2016-04-17 DIAGNOSIS — E7800 Pure hypercholesterolemia, unspecified: Secondary | ICD-10-CM | POA: Diagnosis not present

## 2016-04-17 DIAGNOSIS — N183 Chronic kidney disease, stage 3 (moderate): Secondary | ICD-10-CM | POA: Diagnosis not present

## 2016-05-15 DIAGNOSIS — G9341 Metabolic encephalopathy: Secondary | ICD-10-CM | POA: Diagnosis not present

## 2016-05-15 DIAGNOSIS — K219 Gastro-esophageal reflux disease without esophagitis: Secondary | ICD-10-CM | POA: Diagnosis present

## 2016-05-15 DIAGNOSIS — Z23 Encounter for immunization: Secondary | ICD-10-CM | POA: Diagnosis not present

## 2016-05-15 DIAGNOSIS — E872 Acidosis: Secondary | ICD-10-CM | POA: Diagnosis present

## 2016-05-15 DIAGNOSIS — B9620 Unspecified Escherichia coli [E. coli] as the cause of diseases classified elsewhere: Secondary | ICD-10-CM | POA: Diagnosis present

## 2016-05-15 DIAGNOSIS — I4891 Unspecified atrial fibrillation: Secondary | ICD-10-CM | POA: Diagnosis present

## 2016-05-15 DIAGNOSIS — F209 Schizophrenia, unspecified: Secondary | ICD-10-CM | POA: Diagnosis not present

## 2016-05-15 DIAGNOSIS — Z79899 Other long term (current) drug therapy: Secondary | ICD-10-CM | POA: Diagnosis not present

## 2016-05-15 DIAGNOSIS — F0390 Unspecified dementia without behavioral disturbance: Secondary | ICD-10-CM | POA: Diagnosis not present

## 2016-05-15 DIAGNOSIS — E119 Type 2 diabetes mellitus without complications: Secondary | ICD-10-CM | POA: Diagnosis present

## 2016-05-15 DIAGNOSIS — F419 Anxiety disorder, unspecified: Secondary | ICD-10-CM | POA: Diagnosis present

## 2016-05-15 DIAGNOSIS — Z7982 Long term (current) use of aspirin: Secondary | ICD-10-CM | POA: Diagnosis not present

## 2016-05-15 DIAGNOSIS — N390 Urinary tract infection, site not specified: Secondary | ICD-10-CM | POA: Diagnosis not present

## 2016-05-15 DIAGNOSIS — E039 Hypothyroidism, unspecified: Secondary | ICD-10-CM | POA: Diagnosis present

## 2016-05-15 DIAGNOSIS — K5909 Other constipation: Secondary | ICD-10-CM | POA: Diagnosis present

## 2016-05-15 DIAGNOSIS — A419 Sepsis, unspecified organism: Secondary | ICD-10-CM | POA: Diagnosis not present

## 2016-05-15 DIAGNOSIS — R509 Fever, unspecified: Secondary | ICD-10-CM | POA: Diagnosis not present

## 2016-05-15 DIAGNOSIS — R4182 Altered mental status, unspecified: Secondary | ICD-10-CM | POA: Diagnosis not present

## 2016-05-15 DIAGNOSIS — R9431 Abnormal electrocardiogram [ECG] [EKG]: Secondary | ICD-10-CM | POA: Diagnosis not present

## 2016-06-01 DIAGNOSIS — F250 Schizoaffective disorder, bipolar type: Secondary | ICD-10-CM | POA: Diagnosis not present

## 2016-06-17 DIAGNOSIS — I4891 Unspecified atrial fibrillation: Secondary | ICD-10-CM | POA: Diagnosis not present

## 2016-06-17 DIAGNOSIS — Z6822 Body mass index (BMI) 22.0-22.9, adult: Secondary | ICD-10-CM | POA: Diagnosis not present

## 2016-06-17 DIAGNOSIS — F0390 Unspecified dementia without behavioral disturbance: Secondary | ICD-10-CM | POA: Diagnosis not present

## 2016-06-17 DIAGNOSIS — G9341 Metabolic encephalopathy: Secondary | ICD-10-CM | POA: Diagnosis not present

## 2016-06-17 DIAGNOSIS — R05 Cough: Secondary | ICD-10-CM | POA: Diagnosis not present

## 2016-06-17 DIAGNOSIS — A419 Sepsis, unspecified organism: Secondary | ICD-10-CM | POA: Diagnosis not present

## 2016-06-26 DIAGNOSIS — N390 Urinary tract infection, site not specified: Secondary | ICD-10-CM | POA: Diagnosis not present

## 2016-08-18 DIAGNOSIS — I10 Essential (primary) hypertension: Secondary | ICD-10-CM | POA: Diagnosis not present

## 2016-08-18 DIAGNOSIS — F0390 Unspecified dementia without behavioral disturbance: Secondary | ICD-10-CM | POA: Diagnosis not present

## 2016-08-18 DIAGNOSIS — E119 Type 2 diabetes mellitus without complications: Secondary | ICD-10-CM | POA: Diagnosis not present

## 2016-08-18 DIAGNOSIS — E7800 Pure hypercholesterolemia, unspecified: Secondary | ICD-10-CM | POA: Diagnosis not present

## 2016-08-18 DIAGNOSIS — Z6823 Body mass index (BMI) 23.0-23.9, adult: Secondary | ICD-10-CM | POA: Diagnosis not present

## 2016-08-18 DIAGNOSIS — F209 Schizophrenia, unspecified: Secondary | ICD-10-CM | POA: Diagnosis not present

## 2016-08-18 DIAGNOSIS — D509 Iron deficiency anemia, unspecified: Secondary | ICD-10-CM | POA: Diagnosis not present

## 2016-08-18 DIAGNOSIS — Z79899 Other long term (current) drug therapy: Secondary | ICD-10-CM | POA: Diagnosis not present

## 2016-08-18 DIAGNOSIS — R921 Mammographic calcification found on diagnostic imaging of breast: Secondary | ICD-10-CM | POA: Diagnosis not present

## 2016-08-18 DIAGNOSIS — E039 Hypothyroidism, unspecified: Secondary | ICD-10-CM | POA: Diagnosis not present

## 2016-08-20 DIAGNOSIS — E119 Type 2 diabetes mellitus without complications: Secondary | ICD-10-CM | POA: Diagnosis not present

## 2016-09-15 DIAGNOSIS — Z1389 Encounter for screening for other disorder: Secondary | ICD-10-CM | POA: Diagnosis not present

## 2016-09-15 DIAGNOSIS — Z136 Encounter for screening for cardiovascular disorders: Secondary | ICD-10-CM | POA: Diagnosis not present

## 2016-09-15 DIAGNOSIS — Z1231 Encounter for screening mammogram for malignant neoplasm of breast: Secondary | ICD-10-CM | POA: Diagnosis not present

## 2016-09-15 DIAGNOSIS — Z1211 Encounter for screening for malignant neoplasm of colon: Secondary | ICD-10-CM | POA: Diagnosis not present

## 2016-09-15 DIAGNOSIS — N959 Unspecified menopausal and perimenopausal disorder: Secondary | ICD-10-CM | POA: Diagnosis not present

## 2016-09-15 DIAGNOSIS — Z9181 History of falling: Secondary | ICD-10-CM | POA: Diagnosis not present

## 2016-09-15 DIAGNOSIS — Z0000 Encounter for general adult medical examination without abnormal findings: Secondary | ICD-10-CM | POA: Diagnosis not present

## 2016-09-15 DIAGNOSIS — E785 Hyperlipidemia, unspecified: Secondary | ICD-10-CM | POA: Diagnosis not present

## 2016-10-06 DIAGNOSIS — Z1211 Encounter for screening for malignant neoplasm of colon: Secondary | ICD-10-CM | POA: Diagnosis not present

## 2016-10-06 DIAGNOSIS — Z1212 Encounter for screening for malignant neoplasm of rectum: Secondary | ICD-10-CM | POA: Diagnosis not present

## 2016-10-24 DIAGNOSIS — E7800 Pure hypercholesterolemia, unspecified: Secondary | ICD-10-CM | POA: Diagnosis present

## 2016-10-24 DIAGNOSIS — G4730 Sleep apnea, unspecified: Secondary | ICD-10-CM | POA: Diagnosis present

## 2016-10-24 DIAGNOSIS — R262 Difficulty in walking, not elsewhere classified: Secondary | ICD-10-CM | POA: Diagnosis not present

## 2016-10-24 DIAGNOSIS — R2689 Other abnormalities of gait and mobility: Secondary | ICD-10-CM | POA: Diagnosis not present

## 2016-10-24 DIAGNOSIS — R05 Cough: Secondary | ICD-10-CM | POA: Diagnosis not present

## 2016-10-24 DIAGNOSIS — E039 Hypothyroidism, unspecified: Secondary | ICD-10-CM | POA: Diagnosis not present

## 2016-10-24 DIAGNOSIS — F0390 Unspecified dementia without behavioral disturbance: Secondary | ICD-10-CM | POA: Diagnosis not present

## 2016-10-24 DIAGNOSIS — E1165 Type 2 diabetes mellitus with hyperglycemia: Secondary | ICD-10-CM | POA: Diagnosis not present

## 2016-10-24 DIAGNOSIS — N320 Bladder-neck obstruction: Secondary | ICD-10-CM | POA: Diagnosis not present

## 2016-10-24 DIAGNOSIS — R6520 Severe sepsis without septic shock: Secondary | ICD-10-CM | POA: Diagnosis not present

## 2016-10-24 DIAGNOSIS — M6281 Muscle weakness (generalized): Secondary | ICD-10-CM | POA: Diagnosis not present

## 2016-10-24 DIAGNOSIS — Z7982 Long term (current) use of aspirin: Secondary | ICD-10-CM | POA: Diagnosis not present

## 2016-10-24 DIAGNOSIS — G92 Toxic encephalopathy: Secondary | ICD-10-CM | POA: Diagnosis not present

## 2016-10-24 DIAGNOSIS — G9341 Metabolic encephalopathy: Secondary | ICD-10-CM | POA: Diagnosis not present

## 2016-10-24 DIAGNOSIS — Z79899 Other long term (current) drug therapy: Secondary | ICD-10-CM | POA: Diagnosis not present

## 2016-10-24 DIAGNOSIS — A4189 Other specified sepsis: Secondary | ICD-10-CM | POA: Diagnosis present

## 2016-10-24 DIAGNOSIS — K219 Gastro-esophageal reflux disease without esophagitis: Secondary | ICD-10-CM | POA: Diagnosis present

## 2016-10-24 DIAGNOSIS — Z741 Need for assistance with personal care: Secondary | ICD-10-CM | POA: Diagnosis not present

## 2016-10-24 DIAGNOSIS — G249 Dystonia, unspecified: Secondary | ICD-10-CM | POA: Diagnosis present

## 2016-10-24 DIAGNOSIS — R402441 Other coma, without documented Glasgow coma scale score, or with partial score reported, in the field [EMT or ambulance]: Secondary | ICD-10-CM | POA: Diagnosis not present

## 2016-10-24 DIAGNOSIS — N390 Urinary tract infection, site not specified: Secondary | ICD-10-CM | POA: Diagnosis not present

## 2016-10-24 DIAGNOSIS — E119 Type 2 diabetes mellitus without complications: Secondary | ICD-10-CM | POA: Diagnosis not present

## 2016-10-24 DIAGNOSIS — N179 Acute kidney failure, unspecified: Secondary | ICD-10-CM | POA: Diagnosis not present

## 2016-10-24 DIAGNOSIS — R001 Bradycardia, unspecified: Secondary | ICD-10-CM | POA: Diagnosis not present

## 2016-10-24 DIAGNOSIS — E785 Hyperlipidemia, unspecified: Secondary | ICD-10-CM | POA: Diagnosis not present

## 2016-10-24 DIAGNOSIS — R278 Other lack of coordination: Secondary | ICD-10-CM | POA: Diagnosis not present

## 2016-10-24 DIAGNOSIS — A419 Sepsis, unspecified organism: Secondary | ICD-10-CM | POA: Diagnosis not present

## 2016-10-24 DIAGNOSIS — F209 Schizophrenia, unspecified: Secondary | ICD-10-CM | POA: Diagnosis not present

## 2016-10-24 DIAGNOSIS — G2401 Drug induced subacute dyskinesia: Secondary | ICD-10-CM | POA: Diagnosis not present

## 2016-10-24 DIAGNOSIS — R4182 Altered mental status, unspecified: Secondary | ICD-10-CM | POA: Diagnosis not present

## 2016-10-24 DIAGNOSIS — Z87891 Personal history of nicotine dependence: Secondary | ICD-10-CM | POA: Diagnosis not present

## 2016-10-24 DIAGNOSIS — R633 Feeding difficulties: Secondary | ICD-10-CM | POA: Diagnosis not present

## 2016-10-24 DIAGNOSIS — Z7409 Other reduced mobility: Secondary | ICD-10-CM | POA: Diagnosis not present

## 2016-10-24 DIAGNOSIS — I10 Essential (primary) hypertension: Secondary | ICD-10-CM | POA: Diagnosis not present

## 2016-10-24 DIAGNOSIS — A498 Other bacterial infections of unspecified site: Secondary | ICD-10-CM | POA: Diagnosis not present

## 2016-10-24 DIAGNOSIS — F4310 Post-traumatic stress disorder, unspecified: Secondary | ICD-10-CM | POA: Diagnosis present

## 2016-10-24 DIAGNOSIS — E872 Acidosis: Secondary | ICD-10-CM | POA: Diagnosis not present

## 2016-10-26 DIAGNOSIS — R001 Bradycardia, unspecified: Secondary | ICD-10-CM

## 2016-10-28 DIAGNOSIS — N390 Urinary tract infection, site not specified: Secondary | ICD-10-CM | POA: Diagnosis not present

## 2016-10-28 DIAGNOSIS — R4182 Altered mental status, unspecified: Secondary | ICD-10-CM | POA: Diagnosis not present

## 2016-10-28 DIAGNOSIS — M6281 Muscle weakness (generalized): Secondary | ICD-10-CM | POA: Diagnosis not present

## 2016-10-28 DIAGNOSIS — F0390 Unspecified dementia without behavioral disturbance: Secondary | ICD-10-CM | POA: Diagnosis not present

## 2016-10-28 DIAGNOSIS — I10 Essential (primary) hypertension: Secondary | ICD-10-CM | POA: Diagnosis not present

## 2016-10-28 DIAGNOSIS — R262 Difficulty in walking, not elsewhere classified: Secondary | ICD-10-CM | POA: Diagnosis not present

## 2016-10-28 DIAGNOSIS — G2401 Drug induced subacute dyskinesia: Secondary | ICD-10-CM | POA: Diagnosis not present

## 2016-10-28 DIAGNOSIS — N179 Acute kidney failure, unspecified: Secondary | ICD-10-CM | POA: Diagnosis not present

## 2016-10-28 DIAGNOSIS — F209 Schizophrenia, unspecified: Secondary | ICD-10-CM | POA: Diagnosis not present

## 2016-10-28 DIAGNOSIS — F4310 Post-traumatic stress disorder, unspecified: Secondary | ICD-10-CM | POA: Diagnosis not present

## 2016-10-28 DIAGNOSIS — E1165 Type 2 diabetes mellitus with hyperglycemia: Secondary | ICD-10-CM | POA: Diagnosis not present

## 2016-10-28 DIAGNOSIS — R278 Other lack of coordination: Secondary | ICD-10-CM | POA: Diagnosis not present

## 2016-10-28 DIAGNOSIS — Z741 Need for assistance with personal care: Secondary | ICD-10-CM | POA: Diagnosis not present

## 2016-10-28 DIAGNOSIS — E785 Hyperlipidemia, unspecified: Secondary | ICD-10-CM | POA: Diagnosis not present

## 2016-10-28 DIAGNOSIS — E7800 Pure hypercholesterolemia, unspecified: Secondary | ICD-10-CM | POA: Diagnosis not present

## 2016-10-28 DIAGNOSIS — K219 Gastro-esophageal reflux disease without esophagitis: Secondary | ICD-10-CM | POA: Diagnosis not present

## 2016-10-28 DIAGNOSIS — R633 Feeding difficulties: Secondary | ICD-10-CM | POA: Diagnosis not present

## 2016-10-28 DIAGNOSIS — A419 Sepsis, unspecified organism: Secondary | ICD-10-CM | POA: Diagnosis not present

## 2016-10-28 DIAGNOSIS — F250 Schizoaffective disorder, bipolar type: Secondary | ICD-10-CM | POA: Diagnosis not present

## 2016-10-28 DIAGNOSIS — E119 Type 2 diabetes mellitus without complications: Secondary | ICD-10-CM | POA: Diagnosis not present

## 2016-10-28 DIAGNOSIS — G9341 Metabolic encephalopathy: Secondary | ICD-10-CM | POA: Diagnosis not present

## 2016-10-28 DIAGNOSIS — E039 Hypothyroidism, unspecified: Secondary | ICD-10-CM | POA: Diagnosis not present

## 2016-10-28 DIAGNOSIS — I1310 Hypertensive heart and chronic kidney disease without heart failure, with stage 1 through stage 4 chronic kidney disease, or unspecified chronic kidney disease: Secondary | ICD-10-CM | POA: Diagnosis not present

## 2016-10-28 DIAGNOSIS — A498 Other bacterial infections of unspecified site: Secondary | ICD-10-CM | POA: Diagnosis not present

## 2016-10-28 DIAGNOSIS — Z7409 Other reduced mobility: Secondary | ICD-10-CM | POA: Diagnosis not present

## 2016-10-28 DIAGNOSIS — R6520 Severe sepsis without septic shock: Secondary | ICD-10-CM | POA: Diagnosis not present

## 2016-10-28 DIAGNOSIS — R2689 Other abnormalities of gait and mobility: Secondary | ICD-10-CM | POA: Diagnosis not present

## 2016-10-30 DIAGNOSIS — N390 Urinary tract infection, site not specified: Secondary | ICD-10-CM | POA: Diagnosis not present

## 2016-10-30 DIAGNOSIS — I1310 Hypertensive heart and chronic kidney disease without heart failure, with stage 1 through stage 4 chronic kidney disease, or unspecified chronic kidney disease: Secondary | ICD-10-CM | POA: Diagnosis not present

## 2016-10-30 DIAGNOSIS — E039 Hypothyroidism, unspecified: Secondary | ICD-10-CM | POA: Diagnosis not present

## 2016-10-30 DIAGNOSIS — R262 Difficulty in walking, not elsewhere classified: Secondary | ICD-10-CM | POA: Diagnosis not present

## 2016-11-11 DIAGNOSIS — F250 Schizoaffective disorder, bipolar type: Secondary | ICD-10-CM | POA: Diagnosis not present

## 2016-11-12 DIAGNOSIS — R2689 Other abnormalities of gait and mobility: Secondary | ICD-10-CM | POA: Diagnosis not present

## 2016-11-12 DIAGNOSIS — N189 Chronic kidney disease, unspecified: Secondary | ICD-10-CM | POA: Diagnosis not present

## 2016-11-12 DIAGNOSIS — G2401 Drug induced subacute dyskinesia: Secondary | ICD-10-CM | POA: Diagnosis not present

## 2016-11-12 DIAGNOSIS — M6281 Muscle weakness (generalized): Secondary | ICD-10-CM | POA: Diagnosis not present

## 2016-11-12 DIAGNOSIS — E1122 Type 2 diabetes mellitus with diabetic chronic kidney disease: Secondary | ICD-10-CM | POA: Diagnosis not present

## 2016-11-12 DIAGNOSIS — I129 Hypertensive chronic kidney disease with stage 1 through stage 4 chronic kidney disease, or unspecified chronic kidney disease: Secondary | ICD-10-CM | POA: Diagnosis not present

## 2016-11-13 DIAGNOSIS — M6281 Muscle weakness (generalized): Secondary | ICD-10-CM | POA: Diagnosis not present

## 2016-11-13 DIAGNOSIS — E1122 Type 2 diabetes mellitus with diabetic chronic kidney disease: Secondary | ICD-10-CM | POA: Diagnosis not present

## 2016-11-13 DIAGNOSIS — N189 Chronic kidney disease, unspecified: Secondary | ICD-10-CM | POA: Diagnosis not present

## 2016-11-13 DIAGNOSIS — R2689 Other abnormalities of gait and mobility: Secondary | ICD-10-CM | POA: Diagnosis not present

## 2016-11-13 DIAGNOSIS — G2401 Drug induced subacute dyskinesia: Secondary | ICD-10-CM | POA: Diagnosis not present

## 2016-11-13 DIAGNOSIS — I129 Hypertensive chronic kidney disease with stage 1 through stage 4 chronic kidney disease, or unspecified chronic kidney disease: Secondary | ICD-10-CM | POA: Diagnosis not present

## 2016-11-17 DIAGNOSIS — Z6820 Body mass index (BMI) 20.0-20.9, adult: Secondary | ICD-10-CM | POA: Diagnosis not present

## 2016-11-17 DIAGNOSIS — A419 Sepsis, unspecified organism: Secondary | ICD-10-CM | POA: Diagnosis not present

## 2016-11-17 DIAGNOSIS — E119 Type 2 diabetes mellitus without complications: Secondary | ICD-10-CM | POA: Diagnosis not present

## 2016-11-17 DIAGNOSIS — N179 Acute kidney failure, unspecified: Secondary | ICD-10-CM | POA: Diagnosis not present

## 2016-11-18 DIAGNOSIS — G2401 Drug induced subacute dyskinesia: Secondary | ICD-10-CM | POA: Diagnosis not present

## 2016-11-18 DIAGNOSIS — M6281 Muscle weakness (generalized): Secondary | ICD-10-CM | POA: Diagnosis not present

## 2016-11-18 DIAGNOSIS — N189 Chronic kidney disease, unspecified: Secondary | ICD-10-CM | POA: Diagnosis not present

## 2016-11-18 DIAGNOSIS — I129 Hypertensive chronic kidney disease with stage 1 through stage 4 chronic kidney disease, or unspecified chronic kidney disease: Secondary | ICD-10-CM | POA: Diagnosis not present

## 2016-11-18 DIAGNOSIS — R2689 Other abnormalities of gait and mobility: Secondary | ICD-10-CM | POA: Diagnosis not present

## 2016-11-18 DIAGNOSIS — E1122 Type 2 diabetes mellitus with diabetic chronic kidney disease: Secondary | ICD-10-CM | POA: Diagnosis not present

## 2016-11-20 DIAGNOSIS — R2689 Other abnormalities of gait and mobility: Secondary | ICD-10-CM | POA: Diagnosis not present

## 2016-11-20 DIAGNOSIS — I129 Hypertensive chronic kidney disease with stage 1 through stage 4 chronic kidney disease, or unspecified chronic kidney disease: Secondary | ICD-10-CM | POA: Diagnosis not present

## 2016-11-20 DIAGNOSIS — N189 Chronic kidney disease, unspecified: Secondary | ICD-10-CM | POA: Diagnosis not present

## 2016-11-20 DIAGNOSIS — M6281 Muscle weakness (generalized): Secondary | ICD-10-CM | POA: Diagnosis not present

## 2016-11-20 DIAGNOSIS — G2401 Drug induced subacute dyskinesia: Secondary | ICD-10-CM | POA: Diagnosis not present

## 2016-11-20 DIAGNOSIS — E1122 Type 2 diabetes mellitus with diabetic chronic kidney disease: Secondary | ICD-10-CM | POA: Diagnosis not present

## 2016-11-21 DIAGNOSIS — E1122 Type 2 diabetes mellitus with diabetic chronic kidney disease: Secondary | ICD-10-CM | POA: Diagnosis not present

## 2016-11-21 DIAGNOSIS — I129 Hypertensive chronic kidney disease with stage 1 through stage 4 chronic kidney disease, or unspecified chronic kidney disease: Secondary | ICD-10-CM | POA: Diagnosis not present

## 2016-11-21 DIAGNOSIS — G2401 Drug induced subacute dyskinesia: Secondary | ICD-10-CM | POA: Diagnosis not present

## 2016-11-21 DIAGNOSIS — M6281 Muscle weakness (generalized): Secondary | ICD-10-CM | POA: Diagnosis not present

## 2016-11-21 DIAGNOSIS — R2689 Other abnormalities of gait and mobility: Secondary | ICD-10-CM | POA: Diagnosis not present

## 2016-11-21 DIAGNOSIS — N189 Chronic kidney disease, unspecified: Secondary | ICD-10-CM | POA: Diagnosis not present

## 2016-11-23 DIAGNOSIS — N189 Chronic kidney disease, unspecified: Secondary | ICD-10-CM | POA: Diagnosis not present

## 2016-11-23 DIAGNOSIS — M6281 Muscle weakness (generalized): Secondary | ICD-10-CM | POA: Diagnosis not present

## 2016-11-23 DIAGNOSIS — E1122 Type 2 diabetes mellitus with diabetic chronic kidney disease: Secondary | ICD-10-CM | POA: Diagnosis not present

## 2016-11-23 DIAGNOSIS — G2401 Drug induced subacute dyskinesia: Secondary | ICD-10-CM | POA: Diagnosis not present

## 2016-11-23 DIAGNOSIS — I129 Hypertensive chronic kidney disease with stage 1 through stage 4 chronic kidney disease, or unspecified chronic kidney disease: Secondary | ICD-10-CM | POA: Diagnosis not present

## 2016-11-23 DIAGNOSIS — R2689 Other abnormalities of gait and mobility: Secondary | ICD-10-CM | POA: Diagnosis not present

## 2016-11-25 DIAGNOSIS — M6281 Muscle weakness (generalized): Secondary | ICD-10-CM | POA: Diagnosis not present

## 2016-11-25 DIAGNOSIS — E1122 Type 2 diabetes mellitus with diabetic chronic kidney disease: Secondary | ICD-10-CM | POA: Diagnosis not present

## 2016-11-25 DIAGNOSIS — G2401 Drug induced subacute dyskinesia: Secondary | ICD-10-CM | POA: Diagnosis not present

## 2016-11-25 DIAGNOSIS — N189 Chronic kidney disease, unspecified: Secondary | ICD-10-CM | POA: Diagnosis not present

## 2016-11-25 DIAGNOSIS — I129 Hypertensive chronic kidney disease with stage 1 through stage 4 chronic kidney disease, or unspecified chronic kidney disease: Secondary | ICD-10-CM | POA: Diagnosis not present

## 2016-11-25 DIAGNOSIS — R2689 Other abnormalities of gait and mobility: Secondary | ICD-10-CM | POA: Diagnosis not present

## 2016-11-26 DIAGNOSIS — E1122 Type 2 diabetes mellitus with diabetic chronic kidney disease: Secondary | ICD-10-CM | POA: Diagnosis not present

## 2016-11-26 DIAGNOSIS — G2401 Drug induced subacute dyskinesia: Secondary | ICD-10-CM | POA: Diagnosis not present

## 2016-11-26 DIAGNOSIS — R2689 Other abnormalities of gait and mobility: Secondary | ICD-10-CM | POA: Diagnosis not present

## 2016-11-26 DIAGNOSIS — I129 Hypertensive chronic kidney disease with stage 1 through stage 4 chronic kidney disease, or unspecified chronic kidney disease: Secondary | ICD-10-CM | POA: Diagnosis not present

## 2016-11-26 DIAGNOSIS — N189 Chronic kidney disease, unspecified: Secondary | ICD-10-CM | POA: Diagnosis not present

## 2016-11-26 DIAGNOSIS — M6281 Muscle weakness (generalized): Secondary | ICD-10-CM | POA: Diagnosis not present

## 2016-11-27 DIAGNOSIS — E1122 Type 2 diabetes mellitus with diabetic chronic kidney disease: Secondary | ICD-10-CM | POA: Diagnosis not present

## 2016-11-27 DIAGNOSIS — N189 Chronic kidney disease, unspecified: Secondary | ICD-10-CM | POA: Diagnosis not present

## 2016-11-27 DIAGNOSIS — M6281 Muscle weakness (generalized): Secondary | ICD-10-CM | POA: Diagnosis not present

## 2016-11-27 DIAGNOSIS — R2689 Other abnormalities of gait and mobility: Secondary | ICD-10-CM | POA: Diagnosis not present

## 2016-11-27 DIAGNOSIS — I129 Hypertensive chronic kidney disease with stage 1 through stage 4 chronic kidney disease, or unspecified chronic kidney disease: Secondary | ICD-10-CM | POA: Diagnosis not present

## 2016-11-27 DIAGNOSIS — G2401 Drug induced subacute dyskinesia: Secondary | ICD-10-CM | POA: Diagnosis not present

## 2016-12-01 DIAGNOSIS — I129 Hypertensive chronic kidney disease with stage 1 through stage 4 chronic kidney disease, or unspecified chronic kidney disease: Secondary | ICD-10-CM | POA: Diagnosis not present

## 2016-12-01 DIAGNOSIS — E1122 Type 2 diabetes mellitus with diabetic chronic kidney disease: Secondary | ICD-10-CM | POA: Diagnosis not present

## 2016-12-01 DIAGNOSIS — M6281 Muscle weakness (generalized): Secondary | ICD-10-CM | POA: Diagnosis not present

## 2016-12-01 DIAGNOSIS — G2401 Drug induced subacute dyskinesia: Secondary | ICD-10-CM | POA: Diagnosis not present

## 2016-12-01 DIAGNOSIS — R2689 Other abnormalities of gait and mobility: Secondary | ICD-10-CM | POA: Diagnosis not present

## 2016-12-01 DIAGNOSIS — N189 Chronic kidney disease, unspecified: Secondary | ICD-10-CM | POA: Diagnosis not present

## 2016-12-03 DIAGNOSIS — R2689 Other abnormalities of gait and mobility: Secondary | ICD-10-CM | POA: Diagnosis not present

## 2016-12-03 DIAGNOSIS — G2401 Drug induced subacute dyskinesia: Secondary | ICD-10-CM | POA: Diagnosis not present

## 2016-12-03 DIAGNOSIS — E1122 Type 2 diabetes mellitus with diabetic chronic kidney disease: Secondary | ICD-10-CM | POA: Diagnosis not present

## 2016-12-03 DIAGNOSIS — M6281 Muscle weakness (generalized): Secondary | ICD-10-CM | POA: Diagnosis not present

## 2016-12-03 DIAGNOSIS — I129 Hypertensive chronic kidney disease with stage 1 through stage 4 chronic kidney disease, or unspecified chronic kidney disease: Secondary | ICD-10-CM | POA: Diagnosis not present

## 2016-12-03 DIAGNOSIS — N189 Chronic kidney disease, unspecified: Secondary | ICD-10-CM | POA: Diagnosis not present

## 2016-12-04 DIAGNOSIS — I129 Hypertensive chronic kidney disease with stage 1 through stage 4 chronic kidney disease, or unspecified chronic kidney disease: Secondary | ICD-10-CM | POA: Diagnosis not present

## 2016-12-04 DIAGNOSIS — N189 Chronic kidney disease, unspecified: Secondary | ICD-10-CM | POA: Diagnosis not present

## 2016-12-04 DIAGNOSIS — G2401 Drug induced subacute dyskinesia: Secondary | ICD-10-CM | POA: Diagnosis not present

## 2016-12-04 DIAGNOSIS — E1122 Type 2 diabetes mellitus with diabetic chronic kidney disease: Secondary | ICD-10-CM | POA: Diagnosis not present

## 2016-12-04 DIAGNOSIS — R2689 Other abnormalities of gait and mobility: Secondary | ICD-10-CM | POA: Diagnosis not present

## 2016-12-04 DIAGNOSIS — M6281 Muscle weakness (generalized): Secondary | ICD-10-CM | POA: Diagnosis not present

## 2016-12-07 DIAGNOSIS — E1122 Type 2 diabetes mellitus with diabetic chronic kidney disease: Secondary | ICD-10-CM | POA: Diagnosis not present

## 2016-12-07 DIAGNOSIS — M6281 Muscle weakness (generalized): Secondary | ICD-10-CM | POA: Diagnosis not present

## 2016-12-07 DIAGNOSIS — I129 Hypertensive chronic kidney disease with stage 1 through stage 4 chronic kidney disease, or unspecified chronic kidney disease: Secondary | ICD-10-CM | POA: Diagnosis not present

## 2016-12-07 DIAGNOSIS — N189 Chronic kidney disease, unspecified: Secondary | ICD-10-CM | POA: Diagnosis not present

## 2016-12-07 DIAGNOSIS — G2401 Drug induced subacute dyskinesia: Secondary | ICD-10-CM | POA: Diagnosis not present

## 2016-12-07 DIAGNOSIS — R2689 Other abnormalities of gait and mobility: Secondary | ICD-10-CM | POA: Diagnosis not present

## 2016-12-22 DIAGNOSIS — E1122 Type 2 diabetes mellitus with diabetic chronic kidney disease: Secondary | ICD-10-CM | POA: Diagnosis not present

## 2016-12-22 DIAGNOSIS — R2689 Other abnormalities of gait and mobility: Secondary | ICD-10-CM | POA: Diagnosis not present

## 2016-12-22 DIAGNOSIS — G2401 Drug induced subacute dyskinesia: Secondary | ICD-10-CM | POA: Diagnosis not present

## 2016-12-22 DIAGNOSIS — N189 Chronic kidney disease, unspecified: Secondary | ICD-10-CM | POA: Diagnosis not present

## 2016-12-22 DIAGNOSIS — M6281 Muscle weakness (generalized): Secondary | ICD-10-CM | POA: Diagnosis not present

## 2016-12-22 DIAGNOSIS — I129 Hypertensive chronic kidney disease with stage 1 through stage 4 chronic kidney disease, or unspecified chronic kidney disease: Secondary | ICD-10-CM | POA: Diagnosis not present

## 2016-12-25 DIAGNOSIS — Z23 Encounter for immunization: Secondary | ICD-10-CM | POA: Diagnosis not present

## 2016-12-30 DIAGNOSIS — F250 Schizoaffective disorder, bipolar type: Secondary | ICD-10-CM | POA: Diagnosis not present

## 2017-01-06 DIAGNOSIS — N189 Chronic kidney disease, unspecified: Secondary | ICD-10-CM | POA: Diagnosis not present

## 2017-01-06 DIAGNOSIS — G2401 Drug induced subacute dyskinesia: Secondary | ICD-10-CM | POA: Diagnosis not present

## 2017-01-06 DIAGNOSIS — R2689 Other abnormalities of gait and mobility: Secondary | ICD-10-CM | POA: Diagnosis not present

## 2017-01-06 DIAGNOSIS — M6281 Muscle weakness (generalized): Secondary | ICD-10-CM | POA: Diagnosis not present

## 2017-01-06 DIAGNOSIS — I129 Hypertensive chronic kidney disease with stage 1 through stage 4 chronic kidney disease, or unspecified chronic kidney disease: Secondary | ICD-10-CM | POA: Diagnosis not present

## 2017-01-06 DIAGNOSIS — E1122 Type 2 diabetes mellitus with diabetic chronic kidney disease: Secondary | ICD-10-CM | POA: Diagnosis not present

## 2017-01-27 DIAGNOSIS — F250 Schizoaffective disorder, bipolar type: Secondary | ICD-10-CM | POA: Diagnosis not present

## 2017-02-18 DIAGNOSIS — F0390 Unspecified dementia without behavioral disturbance: Secondary | ICD-10-CM | POA: Diagnosis not present

## 2017-02-18 DIAGNOSIS — L89319 Pressure ulcer of right buttock, unspecified stage: Secondary | ICD-10-CM | POA: Diagnosis not present

## 2017-02-18 DIAGNOSIS — R251 Tremor, unspecified: Secondary | ICD-10-CM | POA: Diagnosis not present

## 2017-02-18 DIAGNOSIS — F209 Schizophrenia, unspecified: Secondary | ICD-10-CM | POA: Diagnosis not present

## 2017-02-18 DIAGNOSIS — E119 Type 2 diabetes mellitus without complications: Secondary | ICD-10-CM | POA: Diagnosis not present

## 2017-02-18 DIAGNOSIS — E039 Hypothyroidism, unspecified: Secondary | ICD-10-CM | POA: Diagnosis not present

## 2017-02-18 DIAGNOSIS — R921 Mammographic calcification found on diagnostic imaging of breast: Secondary | ICD-10-CM | POA: Diagnosis not present

## 2017-02-18 DIAGNOSIS — Z79899 Other long term (current) drug therapy: Secondary | ICD-10-CM | POA: Diagnosis not present

## 2017-02-18 DIAGNOSIS — E559 Vitamin D deficiency, unspecified: Secondary | ICD-10-CM | POA: Diagnosis not present

## 2017-03-03 ENCOUNTER — Ambulatory Visit: Payer: Medicare Other | Admitting: Neurology

## 2017-03-11 DIAGNOSIS — R251 Tremor, unspecified: Secondary | ICD-10-CM | POA: Diagnosis not present

## 2017-03-11 DIAGNOSIS — R921 Mammographic calcification found on diagnostic imaging of breast: Secondary | ICD-10-CM | POA: Diagnosis not present

## 2017-03-17 DIAGNOSIS — F250 Schizoaffective disorder, bipolar type: Secondary | ICD-10-CM | POA: Diagnosis not present

## 2017-03-19 DIAGNOSIS — E538 Deficiency of other specified B group vitamins: Secondary | ICD-10-CM | POA: Diagnosis not present

## 2017-03-19 DIAGNOSIS — E039 Hypothyroidism, unspecified: Secondary | ICD-10-CM | POA: Diagnosis not present

## 2017-03-22 DIAGNOSIS — J189 Pneumonia, unspecified organism: Secondary | ICD-10-CM | POA: Diagnosis not present

## 2017-03-22 DIAGNOSIS — B370 Candidal stomatitis: Secondary | ICD-10-CM | POA: Diagnosis not present

## 2017-03-22 DIAGNOSIS — G252 Other specified forms of tremor: Secondary | ICD-10-CM | POA: Diagnosis not present

## 2017-03-22 DIAGNOSIS — F0390 Unspecified dementia without behavioral disturbance: Secondary | ICD-10-CM | POA: Diagnosis not present

## 2017-03-22 DIAGNOSIS — Z681 Body mass index (BMI) 19 or less, adult: Secondary | ICD-10-CM | POA: Diagnosis not present

## 2017-03-24 ENCOUNTER — Emergency Department (HOSPITAL_COMMUNITY)
Admission: EM | Admit: 2017-03-24 | Discharge: 2017-03-24 | Disposition: A | Discharge status: Home / Self Care | Payer: Medicare Other | Attending: Emergency Medicine | Admitting: Emergency Medicine

## 2017-03-24 ENCOUNTER — Encounter (HOSPITAL_COMMUNITY): Payer: Self-pay

## 2017-03-24 ENCOUNTER — Emergency Department (HOSPITAL_COMMUNITY): Payer: Medicare Other

## 2017-03-24 DIAGNOSIS — E8809 Other disorders of plasma-protein metabolism, not elsewhere classified: Secondary | ICD-10-CM | POA: Diagnosis not present

## 2017-03-24 DIAGNOSIS — R059 Cough, unspecified: Secondary | ICD-10-CM

## 2017-03-24 DIAGNOSIS — J069 Acute upper respiratory infection, unspecified: Secondary | ICD-10-CM | POA: Insufficient documentation

## 2017-03-24 DIAGNOSIS — F0390 Unspecified dementia without behavioral disturbance: Secondary | ICD-10-CM | POA: Insufficient documentation

## 2017-03-24 DIAGNOSIS — N179 Acute kidney failure, unspecified: Secondary | ICD-10-CM | POA: Diagnosis not present

## 2017-03-24 DIAGNOSIS — R404 Transient alteration of awareness: Secondary | ICD-10-CM | POA: Diagnosis not present

## 2017-03-24 DIAGNOSIS — R05 Cough: Secondary | ICD-10-CM

## 2017-03-24 DIAGNOSIS — N390 Urinary tract infection, site not specified: Secondary | ICD-10-CM | POA: Insufficient documentation

## 2017-03-24 DIAGNOSIS — D649 Anemia, unspecified: Secondary | ICD-10-CM | POA: Diagnosis not present

## 2017-03-24 DIAGNOSIS — J189 Pneumonia, unspecified organism: Secondary | ICD-10-CM | POA: Diagnosis not present

## 2017-03-24 DIAGNOSIS — E876 Hypokalemia: Secondary | ICD-10-CM | POA: Diagnosis not present

## 2017-03-24 DIAGNOSIS — R531 Weakness: Secondary | ICD-10-CM | POA: Diagnosis not present

## 2017-03-24 HISTORY — DX: Schizophrenia, unspecified: F20.9

## 2017-03-24 LAB — URINALYSIS, COMPLETE (UACMP) WITH MICROSCOPIC
Bilirubin Urine: NEGATIVE
GLUCOSE, UA: NEGATIVE mg/dL
Hgb urine dipstick: NEGATIVE
Ketones, ur: NEGATIVE mg/dL
Nitrite: NEGATIVE
PH: 5 (ref 5.0–8.0)
Protein, ur: NEGATIVE mg/dL
SPECIFIC GRAVITY, URINE: 1.016 (ref 1.005–1.030)

## 2017-03-24 LAB — CARBAMAZEPINE LEVEL, TOTAL: Carbamazepine Lvl: <2 ug/mL — ABNORMAL LOW (ref 4.0–12.0)

## 2017-03-24 LAB — CBC WITH DIFFERENTIAL/PLATELET
Basophils Absolute: 0 10*3/uL (ref 0.0–0.1)
Basophils Relative: 0 %
EOS ABS: 0.3 10*3/uL (ref 0.0–0.7)
EOS PCT: 4 %
HCT: 34.4 % — ABNORMAL LOW (ref 36.0–46.0)
Hemoglobin: 11 g/dL — ABNORMAL LOW (ref 12.0–15.0)
LYMPHS ABS: 0.6 10*3/uL — AB (ref 0.7–4.0)
LYMPHS PCT: 10 %
MCH: 30.1 pg (ref 26.0–34.0)
MCHC: 32 g/dL (ref 30.0–36.0)
MCV: 94.2 fL (ref 78.0–100.0)
MONO ABS: 0.3 10*3/uL (ref 0.1–1.0)
Monocytes Relative: 5 %
Neutro Abs: 4.8 10*3/uL (ref 1.7–7.7)
Neutrophils Relative %: 81 %
PLATELETS: 163 10*3/uL (ref 150–400)
RBC: 3.65 MIL/uL — ABNORMAL LOW (ref 3.87–5.11)
RDW: 12.7 % (ref 11.5–15.5)
WBC: 6 10*3/uL (ref 4.0–10.5)

## 2017-03-24 LAB — COMPREHENSIVE METABOLIC PANEL
ALBUMIN: 3.1 g/dL — AB (ref 3.5–5.0)
ALT: 14 U/L (ref 14–54)
AST: 24 U/L (ref 15–41)
Alkaline Phosphatase: 74 U/L (ref 38–126)
Anion gap: 11 (ref 5–15)
BILIRUBIN TOTAL: 0.5 mg/dL (ref 0.3–1.2)
BUN: 47 mg/dL — AB (ref 6–20)
CHLORIDE: 108 mmol/L (ref 101–111)
CO2: 25 mmol/L (ref 22–32)
CREATININE: 1.13 mg/dL — AB (ref 0.44–1.00)
Calcium: 10 mg/dL (ref 8.9–10.3)
GFR calc Af Amer: 54 mL/min — ABNORMAL LOW (ref >60)
GFR calc non Af Amer: 47 mL/min — ABNORMAL LOW (ref >60)
GLUCOSE: 141 mg/dL — AB (ref 65–99)
POTASSIUM: 3.4 mmol/L — AB (ref 3.5–5.1)
Sodium: 144 mmol/L (ref 135–145)
TOTAL PROTEIN: 5.9 g/dL — AB (ref 6.5–8.1)

## 2017-03-24 LAB — T4, FREE: Free T4: 1.53 ng/dL — ABNORMAL HIGH (ref 0.61–1.12)

## 2017-03-24 LAB — I-STAT TROPONIN, ED: TROPONIN I, POC: 0.03 ng/mL (ref 0.00–0.08)

## 2017-03-24 LAB — INFLUENZA PANEL BY PCR (TYPE A & B)
INFLAPCR: NEGATIVE
INFLBPCR: NEGATIVE

## 2017-03-24 LAB — I-STAT CG4 LACTIC ACID, ED: Lactic Acid, Venous: 1.83 mmol/L (ref 0.5–1.9)

## 2017-03-24 LAB — AMMONIA: Ammonia: 40 umol/L — ABNORMAL HIGH (ref 9–35)

## 2017-03-24 LAB — TSH: TSH: 0.014 u[IU]/mL — ABNORMAL LOW (ref 0.350–4.500)

## 2017-03-24 MED ORDER — ALBUTEROL SULFATE (2.5 MG/3ML) 0.083% IN NEBU
5.0000 mg | INHALATION_SOLUTION | Freq: Once | RESPIRATORY_TRACT | Status: AC
  Administered 2017-03-24: 5 mg via RESPIRATORY_TRACT
  Filled 2017-03-24: qty 6

## 2017-03-24 MED ORDER — POTASSIUM CHLORIDE CRYS ER 20 MEQ PO TBCR
40.0000 meq | EXTENDED_RELEASE_TABLET | Freq: Once | ORAL | Status: AC
  Administered 2017-03-24: 40 meq via ORAL
  Filled 2017-03-24: qty 2

## 2017-03-24 MED ORDER — ALBUTEROL SULFATE HFA 108 (90 BASE) MCG/ACT IN AERS: 1.0000 | INHALATION_SPRAY | RESPIRATORY_TRACT | 0 refills | Status: AC | PRN

## 2017-03-24 MED ORDER — SODIUM CHLORIDE 0.9 % IV BOLUS (SEPSIS)
1000.0000 mL | Freq: Once | INTRAVENOUS | Status: AC
  Administered 2017-03-24: 1000 mL via INTRAVENOUS

## 2017-03-24 MED ORDER — IPRATROPIUM BROMIDE 0.02 % IN SOLN
0.5000 mg | Freq: Once | RESPIRATORY_TRACT | Status: AC
  Administered 2017-03-24: 0.5 mg via RESPIRATORY_TRACT
  Filled 2017-03-24: qty 2.5

## 2017-03-24 MED ORDER — AEROCHAMBER PLUS W/MASK MISC: 0 refills | Status: AC

## 2017-03-24 NOTE — ED Notes (Signed)
PTAR contacted for tx back to [facility]

## 2017-03-24 NOTE — ED Provider Notes (Signed)
[facility] Provider Note   CSN: [id number] Arrival date & time: 03/24/17  1350     History   Chief Complaint Chief Complaint  Patient presents with  . Pneumonia    HPI Laura Beck is a 72 y.o. female with a PMHx of schizophrenia, dementia, HTN, HLD, anemia, B12 deficiency, GERD, PTSD, hypothyroidism, and DM2, who presents to the ED via EMS from [facility] for evaluation of pneumonia. LEVEL 5 CAVEAT DUE TO DEMENTIA, all history provided by Laura EwingMelissa Beck at [facility].  Ms. Laura GreenWhitley reports that on Saturday the patient developed a fever of 102 and was fatigued and had a dry cough with rhinorrhea.  They took her to her PCP's office on Monday Laura Beck([facility] at [facility]) where she was diagnosed with pneumonia and thrush.  It is unclear whether she had a chest x-ray done or not.  She was started on Augmentin 875-125mg  BID and Fluconazole 150mg  QD which she has been taking for the last 2 days, however the staff feels that she continues to grow weaker and is not improving.  They called the PCPs office today who recommended that she be transported to the hospital for evaluation and admission for IV antibiotics.  They have noticed that the patient has not been eating much, and this morning they noticed some malodorous urine.  She continues to have generalized weakness and fatigue, dry cough, and rhinorrhea.  They deny any wheezing, hematuria, increased urinary frequency, vomiting, diarrhea, or constipation.  They state that she is at her mental baseline and have not noticed any increased confusion.  They deny any other concerns or symptoms noted at this time.  They state that she has tremors at baseline and is mostly nonverbal at baseline.  She mentions also that her Synthroid was recently decreased from 200 mcg to 50 mcg.  Remainder of history and ROS are limited due to patient's nonverbal  state and dementia.   The history is provided by medical records and the nursing home. The history is limited by the absence of a caregiver. No language interpreter was used.  Weakness  Primary symptoms comment: generalized weakness/fatigue. This is a new problem. The current episode started more than 2 days ago. The problem has been gradually worsening. There was no focality noted. The maximum temperature recorded prior to her arrival was 102 to 102.9 F. The fever has been present for 1 to 2 days. Pertinent negatives include no vomiting, no altered mental status and no confusion. Meds prior to arrival: augmentin 875-125mg  and fluconazole 150mg . Associated medical issues include dementia.    Past Medical History:  Diagnosis Date  . Schizophrenia (HCC)     There are no active problems to display for this patient.   History reviewed. No pertinent surgical history.  OB History    No data available       Home Medications    Prior to Admission medications   Not on File    Family History History reviewed. No pertinent family history.  Social History Social History   Tobacco Use  . Smoking status: Never Smoker  . Smokeless tobacco: Never Used  Substance Use Topics  . Alcohol use: No    Frequency: Never  . Drug use: No     Allergies   Patient has no known allergies.   Review of Systems Review of Systems  Unable to perform ROS: Dementia  Constitutional: Positive for activity change,  appetite change, fatigue and fever (102).  HENT: Positive for rhinorrhea.   Respiratory: Positive for cough. Negative for wheezing.   Gastrointestinal: Negative for constipation, diarrhea and vomiting.  Genitourinary: Negative for frequency and hematuria.       +malodorous urine  Allergic/Immunologic: Positive for immunocompromised state (DM2).  Neurological: Positive for weakness.  Psychiatric/Behavioral: Negative for confusion.   LEVEL 5 CAVEAT DUE TO DEMENTIA  Physical  Exam Updated Vital Signs BP 106/74   Pulse 64   Temp 98 F (36.7 C) (Oral)   Resp (!) 21   SpO2 99%   Physical Exam  Constitutional: Vital signs are normal. She appears well-developed and well-nourished.  Non-toxic appearance. No distress.  Afebrile, nontoxic, NAD, thin frail elderly female appearing older than stated age. Nonverbal, but responds to her name  HENT:  Head: Normocephalic and atraumatic.  Mouth/Throat: Oropharynx is clear and moist. Mucous membranes are dry.  Very dry lips and mouth  Eyes: Conjunctivae and EOM are normal. Right eye exhibits no discharge. Left eye exhibits no discharge.  Neck: Normal range of motion. Neck supple.  Cardiovascular: Normal rate, regular rhythm, normal heart sounds and intact distal pulses. Exam reveals no gallop and no friction rub.  No murmur heard. Pulmonary/Chest: Effort normal. No respiratory distress. She has decreased breath sounds. She has no wheezes. She has no rhonchi. She has no rales.  Somewhat diminished lung sounds in lower fields bilaterally, although poor inspiratory effort greatly limits exam. No w/r/r appreciated however difficult to assess due to poor inspiratory effort. No hypoxia or increased WOB, SpO2 99% on RA   Abdominal: Soft. Normal appearance and bowel sounds are normal. She exhibits no distension. There is no tenderness. There is no rigidity, no rebound, no guarding, no CVA tenderness, no tenderness at McBurney's point and negative Murphy's sign.  Musculoskeletal: Normal range of motion.  Baseline ROM and strength in all extremities, doesn't follow commands enough to perform more complete MSK exam. MAE x4.   Neurological: She is alert. She has normal strength. She displays tremor. GCS eye subscore is 4. GCS verbal subscore is 1. GCS motor subscore is 5.  Nonverbal, responds to her name but otherwise doesn't speak or answer questions. Tremor noted, which apparently is her baseline.   Skin: Skin is warm, dry and intact.  No rash noted.  Psychiatric: She has a normal mood and affect.  Nursing note and vitals reviewed.    ED Treatments / Results  Labs (all labs ordered are listed, but only abnormal results are displayed) Labs Reviewed  COMPREHENSIVE METABOLIC PANEL - Abnormal; Notable for the following components:      Result Value   Potassium 3.4 (*)    Glucose, Bld 141 (*)    BUN 47 (*)    Creatinine, Ser 1.13 (*)    Total Protein 5.9 (*)    Albumin 3.1 (*)    GFR calc non Af Amer 47 (*)    GFR calc Af Amer 54 (*)    All other components within normal limits  CBC WITH DIFFERENTIAL/PLATELET - Abnormal; Notable for the following components:   RBC 3.65 (*)    Hemoglobin 11.0 (*)    HCT 34.4 (*)    Lymphs Abs 0.6 (*)    All other components within normal limits  URINALYSIS, COMPLETE (UACMP) WITH MICROSCOPIC - Abnormal; Notable for the following components:   Leukocytes, UA MODERATE (*)    Bacteria, UA RARE (*)    Squamous Epithelial / LPF 0-5 (*)  All other components within normal limits  AMMONIA - Abnormal; Notable for the following components:   Ammonia 40 (*)    All other components within normal limits  CARBAMAZEPINE LEVEL, TOTAL - Abnormal; Notable for the following components:   Carbamazepine Lvl <2.0 (*)    All other components within normal limits  TSH - Abnormal; Notable for the following components:   TSH 0.014 (*)    All other components within normal limits  T4, FREE - Abnormal; Notable for the following components:   Free T4 1.53 (*)    All other components within normal limits  URINE CULTURE  INFLUENZA PANEL BY PCR (TYPE A & B)  I-STAT CG4 LACTIC ACID, ED  I-STAT TROPONIN, ED    EKG  EKG Interpretation  Date/Time:  Wednesday March 24 2017 15:15:17 EST Ventricular Rate:  66 PR Interval:    QRS Duration: 156 QT Interval:  481 QTC Calculation: 504 R Axis:   -24 Text Interpretation:  Sinus rhythm Right bundle branch block No previous ECGs available Confirmed by  Vanetta Mulders [phone]) on 03/24/2017 3:36:14 PM       Radiology Dg Chest 2 View  Result Date: 03/24/2017 CLINICAL DATA:  Cough EXAM: CHEST  2 VIEW COMPARISON:  None. FINDINGS: The heart size and mediastinal contours are within normal limits. Both lungs are clear. Multilevel upper thoracic vertebral body height loss with increased kyphosis. IMPRESSION: No active cardiopulmonary disease. Electronically Signed   By: Deatra Robinson M.D.   On: 03/24/2017 16:43    Procedures Procedures (including critical care time)  Medications Ordered in ED Medications  sodium chloride 0.9 % bolus 1,000 mL (0 mLs Intravenous Stopped 03/24/17 1703)  albuterol (PROVENTIL) (2.5 MG/3ML) 0.083% nebulizer solution 5 mg (5 mg Nebulization Given 03/24/17 1512)  ipratropium (ATROVENT) nebulizer solution 0.5 mg (0.5 mg Nebulization Given 03/24/17 1512)  potassium chloride SA (K-DUR,KLOR-CON) CR tablet 40 mEq (40 mEq Oral Given 03/24/17 1700)     Initial Impression / Assessment and Plan / ED Course  I have reviewed the triage vital signs and the nursing notes.  Pertinent labs & imaging results that were available during my care of the patient were reviewed by me and considered in my medical decision making (see chart for details).     72 y.o. female sent here by nursing home due to generalized weakness and no improvement despite being on day 2 of augmentin for PNA. Diagnosed with PNA and thrush on Monday 2 days ago at PCPs office, started on augmentin and diflucan. Nursing home states she continues to grow weaker and not improving. Level 5 caveat due to nonverbal state and dementia, pt unable to participate with history, all history obtained from nursing staff at nursing home. On exam, very dry mouth/lips, slightly diminished lung sounds in b/l lower fields, no wheezing/rhonchi/rales appreciated but poor inspiratory effort makes it difficult to assess this; no abdominal tenderness appreciated. Afebrile and nontoxic.  Responds to her name, but doesn't really follow commands or answer questions at all, which apparently is her baseline per nursing staff. Will get labs and CXR, give fluids and duoneb, and reassess shortly. Discussed case with my attending Dr. Deretha Emory who agrees with plan.   7:02 PM CBC w/diff with mild anemia, unclear baseline since we have no prior labs; otherwise WNL. CMP with marginally low K 3.4, will replete now; also with mildly elevated BUN 47 and Cr 1.13, unclear baseline, but fluids running; also with hypoalbuminemia and mild hyperglycemia. Lactic WNL. Trop neg. Carbamazepine  level subtherapeutic. Flu test negative.  Ammonia marginally elevated at 40, doubt clinical significance given that she is reportedly at her mental baseline. TSH low and Free T4 elevated, consistent with hyperthyroid state which is why they recently went down on her synthroid; doubt need for further intervention of this, will have them recheck it at her next PCPs visit. EKG with RBBB but otherwise no acute ischemic findings. CXR negative for PNA or other acute cardiopulmonary disease. U/A with moderate leuks, neg nitrites, 6-30 WBCs, 0-5 squamous, and rare bacteria; could be UTI given the 6-30 WBCs, so it's reasonable to keep her on augmentin which would treat for this in addition to any respiratory infection she may have. Lung sounds less diminished after duoneb, no wheezing or rhonchi. Tolerating PO well here. No indication for admission at this time. Will send home with inhaler to use and advised continuation of augmentin to treat UTI as well as respiratory illness (in the event it's bacterial). Advised nursing home to encourage water/PO intake. Discussed other OTC remedies for symptomatic relief of cough/URI symptoms. F/up with PCP in 3-5 days for recheck of symptoms and ongoing management. I explained the diagnosis and have given explicit precautions to return to the ER including for any other new or worsening symptoms. The  patient's nursing home staff understand and accept the medical plan as it's been dictated and I have answered their questions. Discharge instructions concerning home care and prescriptions have been given. The patient is STABLE and is discharged to nursing home in good condition.    Final Clinical Impressions(s) / ED Diagnoses   Final diagnoses:  Cough  Generalized weakness  Chronic anemia  Hypoalbuminemia  Hypokalemia  AKI (acute kidney injury) (HCC)  Upper respiratory tract infection, unspecified type  Lower urinary tract infection    ED Discharge Orders        Ordered    albuterol (PROVENTIL HFA;VENTOLIN HFA) 108 (90 Base) MCG/ACT inhaler  Every 4 hours PRN     03/24/17 1901    Spacer/Aero-Holding Chambers (AEROCHAMBER PLUS WITH MASK) inhaler     03/24/17 [address] [place], PA-C 03/24/17 1902    Vanetta Mulders, MD 03/25/17 2154

## 2017-03-24 NOTE — ED Notes (Signed)
Patient transported to X-ray 

## 2017-03-24 NOTE — Discharge Instructions (Signed)
Continue to keep the patient well-hydrated. Continue to alternate between Tylenol and Ibuprofen for pain or fever. Use Mucinex for cough suppression/expectoration of mucus. Use netipot and flonase to help with nasal congestion. Use inhaler as directed, as needed for cough/chest congestion/wheezing/shortness of breath.   She was found to have a urinary tract infection, but this should be covered by the augmentin antibiotic that she's already taking. Please make sure she continues taking the antibiotic until finished.    Follow up with her primary care doctor in 3-5 days for recheck of ongoing symptoms. Return to emergency department for emergent changing or worsening of symptoms.

## 2017-03-24 NOTE — ED Provider Notes (Signed)
Medical screening examination/treatment/procedure(s) were conducted as a shared visit with non-physician practitioner(s) and myself.  I personally evaluated the patient during the encounter.   EKG Interpretation  Date/Time:  Wednesday March 24 2017 15:15:17 EST Ventricular Rate:  66 PR Interval:    QRS Duration: 156 QT Interval:  481 QTC Calculation: 504 R Axis:   -24 Text Interpretation:  Sinus rhythm Right bundle branch block No previous ECGs available Confirmed by Vanetta MuldersZackowski, Lavina Resor [phone]([id number]) on 03/24/2017 3:36:14 PM       Patient seen by me along with the physician assistant.  Patient arriving from [facility].  EMS was told that she was hypoxic.  But patient has been with good oxygen saturations all the way in and has remained as such here.  Supposedly diagnosed by primary provider on Monday with pneumonia.  Started on antibiotics for the past 2 days.  Patient has a history of significant dementia and tremors and her mental status is reportedly baseline.  Workup will be directed towards trying to dental find pneumonia.  Based on patient's age if there is evidence of pneumonia probably will warrant admission.  Clinically here patient seems reasonably stable not febrile not tachycardic blood pressures normal respiratory rate not significantly up.  She is awake clearly has dementia limited verbal response.  Lungs clear heart regular abdomen soft.   Vanetta MuldersZackowski, Keyna Blizard, MD 03/24/17 [phone]

## 2017-03-24 NOTE — ED Notes (Signed)
[facility] notified pt returning per [place]

## 2017-03-24 NOTE — ED Notes (Signed)
Pt stable, states understanding of discharge instructions, [facility] updated by PA Mercedes.

## 2017-03-24 NOTE — ED Triage Notes (Signed)
Pt arrived via [facility] from adult care facility after being diagnosed with PNA at PCP office on Monday. Has been taking antibiotics as prescribed for 2 days per EMS.  Pt's baseline is dementia, tremors, limited verbal response.

## 2017-03-25 LAB — URINE CULTURE: Culture: NO GROWTH

## 2017-03-31 DIAGNOSIS — N183 Chronic kidney disease, stage 3 (moderate): Secondary | ICD-10-CM | POA: Diagnosis not present

## 2017-03-31 DIAGNOSIS — E538 Deficiency of other specified B group vitamins: Secondary | ICD-10-CM | POA: Diagnosis not present

## 2017-03-31 DIAGNOSIS — I10 Essential (primary) hypertension: Secondary | ICD-10-CM | POA: Diagnosis not present

## 2017-03-31 DIAGNOSIS — F4310 Post-traumatic stress disorder, unspecified: Secondary | ICD-10-CM | POA: Diagnosis not present

## 2017-03-31 DIAGNOSIS — E559 Vitamin D deficiency, unspecified: Secondary | ICD-10-CM | POA: Diagnosis not present

## 2017-03-31 DIAGNOSIS — F209 Schizophrenia, unspecified: Secondary | ICD-10-CM | POA: Diagnosis not present

## 2017-03-31 DIAGNOSIS — D509 Iron deficiency anemia, unspecified: Secondary | ICD-10-CM | POA: Diagnosis not present

## 2017-03-31 DIAGNOSIS — G309 Alzheimer's disease, unspecified: Secondary | ICD-10-CM | POA: Diagnosis not present

## 2017-03-31 DIAGNOSIS — E039 Hypothyroidism, unspecified: Secondary | ICD-10-CM | POA: Diagnosis not present

## 2017-03-31 DIAGNOSIS — E7800 Pure hypercholesterolemia, unspecified: Secondary | ICD-10-CM | POA: Diagnosis not present

## 2017-04-02 DIAGNOSIS — E538 Deficiency of other specified B group vitamins: Secondary | ICD-10-CM | POA: Diagnosis not present

## 2017-04-02 DIAGNOSIS — E7800 Pure hypercholesterolemia, unspecified: Secondary | ICD-10-CM | POA: Diagnosis not present

## 2017-04-02 DIAGNOSIS — N183 Chronic kidney disease, stage 3 (moderate): Secondary | ICD-10-CM | POA: Diagnosis not present

## 2017-04-02 DIAGNOSIS — G309 Alzheimer's disease, unspecified: Secondary | ICD-10-CM | POA: Diagnosis not present

## 2017-04-02 DIAGNOSIS — D509 Iron deficiency anemia, unspecified: Secondary | ICD-10-CM | POA: Diagnosis not present

## 2017-04-02 DIAGNOSIS — I10 Essential (primary) hypertension: Secondary | ICD-10-CM | POA: Diagnosis not present

## 2017-04-02 DIAGNOSIS — E039 Hypothyroidism, unspecified: Secondary | ICD-10-CM | POA: Diagnosis not present

## 2017-04-02 DIAGNOSIS — E559 Vitamin D deficiency, unspecified: Secondary | ICD-10-CM | POA: Diagnosis not present

## 2017-04-02 DIAGNOSIS — F4310 Post-traumatic stress disorder, unspecified: Secondary | ICD-10-CM | POA: Diagnosis not present

## 2017-04-02 DIAGNOSIS — F209 Schizophrenia, unspecified: Secondary | ICD-10-CM | POA: Diagnosis not present

## 2017-04-05 DIAGNOSIS — N390 Urinary tract infection, site not specified: Secondary | ICD-10-CM | POA: Diagnosis not present

## 2017-04-05 DIAGNOSIS — G309 Alzheimer's disease, unspecified: Secondary | ICD-10-CM | POA: Diagnosis not present

## 2017-04-05 DIAGNOSIS — Z515 Encounter for palliative care: Secondary | ICD-10-CM | POA: Diagnosis not present

## 2017-04-05 DIAGNOSIS — N183 Chronic kidney disease, stage 3 (moderate): Secondary | ICD-10-CM | POA: Diagnosis not present

## 2017-04-05 DIAGNOSIS — F0390 Unspecified dementia without behavioral disturbance: Secondary | ICD-10-CM | POA: Diagnosis not present

## 2017-04-05 DIAGNOSIS — F4310 Post-traumatic stress disorder, unspecified: Secondary | ICD-10-CM | POA: Diagnosis not present

## 2017-04-05 DIAGNOSIS — E039 Hypothyroidism, unspecified: Secondary | ICD-10-CM | POA: Diagnosis not present

## 2017-04-05 DIAGNOSIS — F209 Schizophrenia, unspecified: Secondary | ICD-10-CM | POA: Diagnosis not present

## 2017-04-05 DIAGNOSIS — Z681 Body mass index (BMI) 19 or less, adult: Secondary | ICD-10-CM | POA: Diagnosis not present

## 2017-04-05 DIAGNOSIS — E7800 Pure hypercholesterolemia, unspecified: Secondary | ICD-10-CM | POA: Diagnosis not present

## 2017-04-06 DIAGNOSIS — E7800 Pure hypercholesterolemia, unspecified: Secondary | ICD-10-CM | POA: Diagnosis not present

## 2017-04-06 DIAGNOSIS — F4310 Post-traumatic stress disorder, unspecified: Secondary | ICD-10-CM | POA: Diagnosis not present

## 2017-04-06 DIAGNOSIS — F209 Schizophrenia, unspecified: Secondary | ICD-10-CM | POA: Diagnosis not present

## 2017-04-06 DIAGNOSIS — N183 Chronic kidney disease, stage 3 (moderate): Secondary | ICD-10-CM | POA: Diagnosis not present

## 2017-04-06 DIAGNOSIS — G309 Alzheimer's disease, unspecified: Secondary | ICD-10-CM | POA: Diagnosis not present

## 2017-04-06 DIAGNOSIS — E039 Hypothyroidism, unspecified: Secondary | ICD-10-CM | POA: Diagnosis not present

## 2017-04-07 DIAGNOSIS — F4310 Post-traumatic stress disorder, unspecified: Secondary | ICD-10-CM | POA: Diagnosis not present

## 2017-04-07 DIAGNOSIS — N183 Chronic kidney disease, stage 3 (moderate): Secondary | ICD-10-CM | POA: Diagnosis not present

## 2017-04-07 DIAGNOSIS — G309 Alzheimer's disease, unspecified: Secondary | ICD-10-CM | POA: Diagnosis not present

## 2017-04-07 DIAGNOSIS — E039 Hypothyroidism, unspecified: Secondary | ICD-10-CM | POA: Diagnosis not present

## 2017-04-07 DIAGNOSIS — F209 Schizophrenia, unspecified: Secondary | ICD-10-CM | POA: Diagnosis not present

## 2017-04-07 DIAGNOSIS — E7800 Pure hypercholesterolemia, unspecified: Secondary | ICD-10-CM | POA: Diagnosis not present

## 2017-04-08 DIAGNOSIS — F4310 Post-traumatic stress disorder, unspecified: Secondary | ICD-10-CM | POA: Diagnosis not present

## 2017-04-08 DIAGNOSIS — E7800 Pure hypercholesterolemia, unspecified: Secondary | ICD-10-CM | POA: Diagnosis not present

## 2017-04-08 DIAGNOSIS — G309 Alzheimer's disease, unspecified: Secondary | ICD-10-CM | POA: Diagnosis not present

## 2017-04-08 DIAGNOSIS — F209 Schizophrenia, unspecified: Secondary | ICD-10-CM | POA: Diagnosis not present

## 2017-04-08 DIAGNOSIS — N183 Chronic kidney disease, stage 3 (moderate): Secondary | ICD-10-CM | POA: Diagnosis not present

## 2017-04-08 DIAGNOSIS — E039 Hypothyroidism, unspecified: Secondary | ICD-10-CM | POA: Diagnosis not present

## 2017-04-09 DIAGNOSIS — F4310 Post-traumatic stress disorder, unspecified: Secondary | ICD-10-CM | POA: Diagnosis not present

## 2017-04-09 DIAGNOSIS — E039 Hypothyroidism, unspecified: Secondary | ICD-10-CM | POA: Diagnosis not present

## 2017-04-09 DIAGNOSIS — G309 Alzheimer's disease, unspecified: Secondary | ICD-10-CM | POA: Diagnosis not present

## 2017-04-09 DIAGNOSIS — E7800 Pure hypercholesterolemia, unspecified: Secondary | ICD-10-CM | POA: Diagnosis not present

## 2017-04-09 DIAGNOSIS — N183 Chronic kidney disease, stage 3 (moderate): Secondary | ICD-10-CM | POA: Diagnosis not present

## 2017-04-09 DIAGNOSIS — F209 Schizophrenia, unspecified: Secondary | ICD-10-CM | POA: Diagnosis not present

## 2017-04-12 DIAGNOSIS — N183 Chronic kidney disease, stage 3 (moderate): Secondary | ICD-10-CM | POA: Diagnosis not present

## 2017-04-12 DIAGNOSIS — E039 Hypothyroidism, unspecified: Secondary | ICD-10-CM | POA: Diagnosis not present

## 2017-04-12 DIAGNOSIS — F4310 Post-traumatic stress disorder, unspecified: Secondary | ICD-10-CM | POA: Diagnosis not present

## 2017-04-12 DIAGNOSIS — G309 Alzheimer's disease, unspecified: Secondary | ICD-10-CM | POA: Diagnosis not present

## 2017-04-12 DIAGNOSIS — F209 Schizophrenia, unspecified: Secondary | ICD-10-CM | POA: Diagnosis not present

## 2017-04-12 DIAGNOSIS — E7800 Pure hypercholesterolemia, unspecified: Secondary | ICD-10-CM | POA: Diagnosis not present

## 2017-04-13 DIAGNOSIS — E7800 Pure hypercholesterolemia, unspecified: Secondary | ICD-10-CM | POA: Diagnosis not present

## 2017-04-13 DIAGNOSIS — E039 Hypothyroidism, unspecified: Secondary | ICD-10-CM | POA: Diagnosis not present

## 2017-04-13 DIAGNOSIS — G309 Alzheimer's disease, unspecified: Secondary | ICD-10-CM | POA: Diagnosis not present

## 2017-04-13 DIAGNOSIS — N183 Chronic kidney disease, stage 3 (moderate): Secondary | ICD-10-CM | POA: Diagnosis not present

## 2017-04-13 DIAGNOSIS — F4310 Post-traumatic stress disorder, unspecified: Secondary | ICD-10-CM | POA: Diagnosis not present

## 2017-04-13 DIAGNOSIS — F209 Schizophrenia, unspecified: Secondary | ICD-10-CM | POA: Diagnosis not present

## 2017-04-14 DIAGNOSIS — G309 Alzheimer's disease, unspecified: Secondary | ICD-10-CM | POA: Diagnosis not present

## 2017-04-14 DIAGNOSIS — E7800 Pure hypercholesterolemia, unspecified: Secondary | ICD-10-CM | POA: Diagnosis not present

## 2017-04-14 DIAGNOSIS — F209 Schizophrenia, unspecified: Secondary | ICD-10-CM | POA: Diagnosis not present

## 2017-04-14 DIAGNOSIS — N183 Chronic kidney disease, stage 3 (moderate): Secondary | ICD-10-CM | POA: Diagnosis not present

## 2017-04-14 DIAGNOSIS — F4310 Post-traumatic stress disorder, unspecified: Secondary | ICD-10-CM | POA: Diagnosis not present

## 2017-04-14 DIAGNOSIS — E039 Hypothyroidism, unspecified: Secondary | ICD-10-CM | POA: Diagnosis not present

## 2017-04-15 DIAGNOSIS — E7800 Pure hypercholesterolemia, unspecified: Secondary | ICD-10-CM | POA: Diagnosis not present

## 2017-04-15 DIAGNOSIS — G309 Alzheimer's disease, unspecified: Secondary | ICD-10-CM | POA: Diagnosis not present

## 2017-04-15 DIAGNOSIS — E039 Hypothyroidism, unspecified: Secondary | ICD-10-CM | POA: Diagnosis not present

## 2017-04-15 DIAGNOSIS — N183 Chronic kidney disease, stage 3 (moderate): Secondary | ICD-10-CM | POA: Diagnosis not present

## 2017-04-15 DIAGNOSIS — F209 Schizophrenia, unspecified: Secondary | ICD-10-CM | POA: Diagnosis not present

## 2017-04-15 DIAGNOSIS — F4310 Post-traumatic stress disorder, unspecified: Secondary | ICD-10-CM | POA: Diagnosis not present

## 2017-04-16 DIAGNOSIS — F4310 Post-traumatic stress disorder, unspecified: Secondary | ICD-10-CM | POA: Diagnosis not present

## 2017-04-16 DIAGNOSIS — E039 Hypothyroidism, unspecified: Secondary | ICD-10-CM | POA: Diagnosis not present

## 2017-04-16 DIAGNOSIS — G309 Alzheimer's disease, unspecified: Secondary | ICD-10-CM | POA: Diagnosis not present

## 2017-04-16 DIAGNOSIS — F209 Schizophrenia, unspecified: Secondary | ICD-10-CM | POA: Diagnosis not present

## 2017-04-16 DIAGNOSIS — E7800 Pure hypercholesterolemia, unspecified: Secondary | ICD-10-CM | POA: Diagnosis not present

## 2017-04-16 DIAGNOSIS — N183 Chronic kidney disease, stage 3 (moderate): Secondary | ICD-10-CM | POA: Diagnosis not present

## 2017-04-19 ENCOUNTER — Ambulatory Visit: Payer: Medicare Other | Admitting: Neurology

## 2017-04-19 DIAGNOSIS — F4310 Post-traumatic stress disorder, unspecified: Secondary | ICD-10-CM | POA: Diagnosis not present

## 2017-04-19 DIAGNOSIS — E7800 Pure hypercholesterolemia, unspecified: Secondary | ICD-10-CM | POA: Diagnosis not present

## 2017-04-19 DIAGNOSIS — E039 Hypothyroidism, unspecified: Secondary | ICD-10-CM | POA: Diagnosis not present

## 2017-04-19 DIAGNOSIS — N183 Chronic kidney disease, stage 3 (moderate): Secondary | ICD-10-CM | POA: Diagnosis not present

## 2017-04-19 DIAGNOSIS — G309 Alzheimer's disease, unspecified: Secondary | ICD-10-CM | POA: Diagnosis not present

## 2017-04-19 DIAGNOSIS — F209 Schizophrenia, unspecified: Secondary | ICD-10-CM | POA: Diagnosis not present

## 2017-04-20 DIAGNOSIS — N183 Chronic kidney disease, stage 3 (moderate): Secondary | ICD-10-CM | POA: Diagnosis not present

## 2017-04-20 DIAGNOSIS — E039 Hypothyroidism, unspecified: Secondary | ICD-10-CM | POA: Diagnosis not present

## 2017-04-20 DIAGNOSIS — F209 Schizophrenia, unspecified: Secondary | ICD-10-CM | POA: Diagnosis not present

## 2017-04-20 DIAGNOSIS — E7800 Pure hypercholesterolemia, unspecified: Secondary | ICD-10-CM | POA: Diagnosis not present

## 2017-04-20 DIAGNOSIS — F4310 Post-traumatic stress disorder, unspecified: Secondary | ICD-10-CM | POA: Diagnosis not present

## 2017-04-20 DIAGNOSIS — G309 Alzheimer's disease, unspecified: Secondary | ICD-10-CM | POA: Diagnosis not present

## 2017-04-21 DIAGNOSIS — F209 Schizophrenia, unspecified: Secondary | ICD-10-CM | POA: Diagnosis not present

## 2017-04-21 DIAGNOSIS — N183 Chronic kidney disease, stage 3 (moderate): Secondary | ICD-10-CM | POA: Diagnosis not present

## 2017-04-21 DIAGNOSIS — G309 Alzheimer's disease, unspecified: Secondary | ICD-10-CM | POA: Diagnosis not present

## 2017-04-21 DIAGNOSIS — F4310 Post-traumatic stress disorder, unspecified: Secondary | ICD-10-CM | POA: Diagnosis not present

## 2017-04-21 DIAGNOSIS — E039 Hypothyroidism, unspecified: Secondary | ICD-10-CM | POA: Diagnosis not present

## 2017-04-21 DIAGNOSIS — E7800 Pure hypercholesterolemia, unspecified: Secondary | ICD-10-CM | POA: Diagnosis not present

## 2017-04-22 DIAGNOSIS — F4310 Post-traumatic stress disorder, unspecified: Secondary | ICD-10-CM | POA: Diagnosis not present

## 2017-04-22 DIAGNOSIS — N183 Chronic kidney disease, stage 3 (moderate): Secondary | ICD-10-CM | POA: Diagnosis not present

## 2017-04-22 DIAGNOSIS — G309 Alzheimer's disease, unspecified: Secondary | ICD-10-CM | POA: Diagnosis not present

## 2017-04-22 DIAGNOSIS — E039 Hypothyroidism, unspecified: Secondary | ICD-10-CM | POA: Diagnosis not present

## 2017-04-22 DIAGNOSIS — F209 Schizophrenia, unspecified: Secondary | ICD-10-CM | POA: Diagnosis not present

## 2017-04-22 DIAGNOSIS — E7800 Pure hypercholesterolemia, unspecified: Secondary | ICD-10-CM | POA: Diagnosis not present

## 2017-04-23 DIAGNOSIS — E7800 Pure hypercholesterolemia, unspecified: Secondary | ICD-10-CM | POA: Diagnosis not present

## 2017-04-23 DIAGNOSIS — N183 Chronic kidney disease, stage 3 (moderate): Secondary | ICD-10-CM | POA: Diagnosis not present

## 2017-04-23 DIAGNOSIS — F209 Schizophrenia, unspecified: Secondary | ICD-10-CM | POA: Diagnosis not present

## 2017-04-23 DIAGNOSIS — G309 Alzheimer's disease, unspecified: Secondary | ICD-10-CM | POA: Diagnosis not present

## 2017-04-23 DIAGNOSIS — F4310 Post-traumatic stress disorder, unspecified: Secondary | ICD-10-CM | POA: Diagnosis not present

## 2017-04-23 DIAGNOSIS — E039 Hypothyroidism, unspecified: Secondary | ICD-10-CM | POA: Diagnosis not present

## 2017-04-26 DIAGNOSIS — F4310 Post-traumatic stress disorder, unspecified: Secondary | ICD-10-CM | POA: Diagnosis not present

## 2017-04-26 DIAGNOSIS — F209 Schizophrenia, unspecified: Secondary | ICD-10-CM | POA: Diagnosis not present

## 2017-04-26 DIAGNOSIS — E039 Hypothyroidism, unspecified: Secondary | ICD-10-CM | POA: Diagnosis not present

## 2017-04-26 DIAGNOSIS — N183 Chronic kidney disease, stage 3 (moderate): Secondary | ICD-10-CM | POA: Diagnosis not present

## 2017-04-26 DIAGNOSIS — E7800 Pure hypercholesterolemia, unspecified: Secondary | ICD-10-CM | POA: Diagnosis not present

## 2017-04-26 DIAGNOSIS — G309 Alzheimer's disease, unspecified: Secondary | ICD-10-CM | POA: Diagnosis not present

## 2017-04-27 DIAGNOSIS — F4310 Post-traumatic stress disorder, unspecified: Secondary | ICD-10-CM | POA: Diagnosis not present

## 2017-04-27 DIAGNOSIS — G309 Alzheimer's disease, unspecified: Secondary | ICD-10-CM | POA: Diagnosis not present

## 2017-04-27 DIAGNOSIS — E7800 Pure hypercholesterolemia, unspecified: Secondary | ICD-10-CM | POA: Diagnosis not present

## 2017-04-27 DIAGNOSIS — N183 Chronic kidney disease, stage 3 (moderate): Secondary | ICD-10-CM | POA: Diagnosis not present

## 2017-04-27 DIAGNOSIS — E039 Hypothyroidism, unspecified: Secondary | ICD-10-CM | POA: Diagnosis not present

## 2017-04-27 DIAGNOSIS — F209 Schizophrenia, unspecified: Secondary | ICD-10-CM | POA: Diagnosis not present

## 2017-04-28 DIAGNOSIS — N183 Chronic kidney disease, stage 3 (moderate): Secondary | ICD-10-CM | POA: Diagnosis not present

## 2017-04-28 DIAGNOSIS — E7800 Pure hypercholesterolemia, unspecified: Secondary | ICD-10-CM | POA: Diagnosis not present

## 2017-04-28 DIAGNOSIS — G309 Alzheimer's disease, unspecified: Secondary | ICD-10-CM | POA: Diagnosis not present

## 2017-04-28 DIAGNOSIS — F4310 Post-traumatic stress disorder, unspecified: Secondary | ICD-10-CM | POA: Diagnosis not present

## 2017-04-28 DIAGNOSIS — E039 Hypothyroidism, unspecified: Secondary | ICD-10-CM | POA: Diagnosis not present

## 2017-04-28 DIAGNOSIS — F209 Schizophrenia, unspecified: Secondary | ICD-10-CM | POA: Diagnosis not present

## 2017-04-29 DIAGNOSIS — G309 Alzheimer's disease, unspecified: Secondary | ICD-10-CM | POA: Diagnosis not present

## 2017-04-29 DIAGNOSIS — N183 Chronic kidney disease, stage 3 (moderate): Secondary | ICD-10-CM | POA: Diagnosis not present

## 2017-04-29 DIAGNOSIS — F209 Schizophrenia, unspecified: Secondary | ICD-10-CM | POA: Diagnosis not present

## 2017-04-29 DIAGNOSIS — F4310 Post-traumatic stress disorder, unspecified: Secondary | ICD-10-CM | POA: Diagnosis not present

## 2017-04-29 DIAGNOSIS — E7800 Pure hypercholesterolemia, unspecified: Secondary | ICD-10-CM | POA: Diagnosis not present

## 2017-04-29 DIAGNOSIS — E039 Hypothyroidism, unspecified: Secondary | ICD-10-CM | POA: Diagnosis not present

## 2017-04-30 DIAGNOSIS — F209 Schizophrenia, unspecified: Secondary | ICD-10-CM | POA: Diagnosis not present

## 2017-04-30 DIAGNOSIS — E559 Vitamin D deficiency, unspecified: Secondary | ICD-10-CM | POA: Diagnosis not present

## 2017-04-30 DIAGNOSIS — D509 Iron deficiency anemia, unspecified: Secondary | ICD-10-CM | POA: Diagnosis not present

## 2017-04-30 DIAGNOSIS — G309 Alzheimer's disease, unspecified: Secondary | ICD-10-CM | POA: Diagnosis not present

## 2017-04-30 DIAGNOSIS — E7800 Pure hypercholesterolemia, unspecified: Secondary | ICD-10-CM | POA: Diagnosis not present

## 2017-04-30 DIAGNOSIS — E039 Hypothyroidism, unspecified: Secondary | ICD-10-CM | POA: Diagnosis not present

## 2017-04-30 DIAGNOSIS — N183 Chronic kidney disease, stage 3 (moderate): Secondary | ICD-10-CM | POA: Diagnosis not present

## 2017-04-30 DIAGNOSIS — I10 Essential (primary) hypertension: Secondary | ICD-10-CM | POA: Diagnosis not present

## 2017-04-30 DIAGNOSIS — E538 Deficiency of other specified B group vitamins: Secondary | ICD-10-CM | POA: Diagnosis not present

## 2017-04-30 DIAGNOSIS — F4310 Post-traumatic stress disorder, unspecified: Secondary | ICD-10-CM | POA: Diagnosis not present

## 2017-05-03 DIAGNOSIS — G309 Alzheimer's disease, unspecified: Secondary | ICD-10-CM | POA: Diagnosis not present

## 2017-05-03 DIAGNOSIS — F4310 Post-traumatic stress disorder, unspecified: Secondary | ICD-10-CM | POA: Diagnosis not present

## 2017-05-03 DIAGNOSIS — N183 Chronic kidney disease, stage 3 (moderate): Secondary | ICD-10-CM | POA: Diagnosis not present

## 2017-05-03 DIAGNOSIS — F209 Schizophrenia, unspecified: Secondary | ICD-10-CM | POA: Diagnosis not present

## 2017-05-03 DIAGNOSIS — E039 Hypothyroidism, unspecified: Secondary | ICD-10-CM | POA: Diagnosis not present

## 2017-05-03 DIAGNOSIS — E7800 Pure hypercholesterolemia, unspecified: Secondary | ICD-10-CM | POA: Diagnosis not present

## 2017-05-04 DIAGNOSIS — F209 Schizophrenia, unspecified: Secondary | ICD-10-CM | POA: Diagnosis not present

## 2017-05-04 DIAGNOSIS — F4310 Post-traumatic stress disorder, unspecified: Secondary | ICD-10-CM | POA: Diagnosis not present

## 2017-05-04 DIAGNOSIS — N183 Chronic kidney disease, stage 3 (moderate): Secondary | ICD-10-CM | POA: Diagnosis not present

## 2017-05-04 DIAGNOSIS — G309 Alzheimer's disease, unspecified: Secondary | ICD-10-CM | POA: Diagnosis not present

## 2017-05-04 DIAGNOSIS — E7800 Pure hypercholesterolemia, unspecified: Secondary | ICD-10-CM | POA: Diagnosis not present

## 2017-05-04 DIAGNOSIS — E039 Hypothyroidism, unspecified: Secondary | ICD-10-CM | POA: Diagnosis not present

## 2017-05-05 DIAGNOSIS — E039 Hypothyroidism, unspecified: Secondary | ICD-10-CM | POA: Diagnosis not present

## 2017-05-05 DIAGNOSIS — E7800 Pure hypercholesterolemia, unspecified: Secondary | ICD-10-CM | POA: Diagnosis not present

## 2017-05-05 DIAGNOSIS — F4310 Post-traumatic stress disorder, unspecified: Secondary | ICD-10-CM | POA: Diagnosis not present

## 2017-05-05 DIAGNOSIS — N183 Chronic kidney disease, stage 3 (moderate): Secondary | ICD-10-CM | POA: Diagnosis not present

## 2017-05-05 DIAGNOSIS — F209 Schizophrenia, unspecified: Secondary | ICD-10-CM | POA: Diagnosis not present

## 2017-05-05 DIAGNOSIS — G309 Alzheimer's disease, unspecified: Secondary | ICD-10-CM | POA: Diagnosis not present

## 2017-05-06 DIAGNOSIS — E7800 Pure hypercholesterolemia, unspecified: Secondary | ICD-10-CM | POA: Diagnosis not present

## 2017-05-06 DIAGNOSIS — G309 Alzheimer's disease, unspecified: Secondary | ICD-10-CM | POA: Diagnosis not present

## 2017-05-06 DIAGNOSIS — F209 Schizophrenia, unspecified: Secondary | ICD-10-CM | POA: Diagnosis not present

## 2017-05-06 DIAGNOSIS — F4310 Post-traumatic stress disorder, unspecified: Secondary | ICD-10-CM | POA: Diagnosis not present

## 2017-05-06 DIAGNOSIS — E039 Hypothyroidism, unspecified: Secondary | ICD-10-CM | POA: Diagnosis not present

## 2017-05-06 DIAGNOSIS — N183 Chronic kidney disease, stage 3 (moderate): Secondary | ICD-10-CM | POA: Diagnosis not present

## 2017-05-07 DIAGNOSIS — F4310 Post-traumatic stress disorder, unspecified: Secondary | ICD-10-CM | POA: Diagnosis not present

## 2017-05-07 DIAGNOSIS — G309 Alzheimer's disease, unspecified: Secondary | ICD-10-CM | POA: Diagnosis not present

## 2017-05-07 DIAGNOSIS — E7800 Pure hypercholesterolemia, unspecified: Secondary | ICD-10-CM | POA: Diagnosis not present

## 2017-05-07 DIAGNOSIS — N183 Chronic kidney disease, stage 3 (moderate): Secondary | ICD-10-CM | POA: Diagnosis not present

## 2017-05-07 DIAGNOSIS — E039 Hypothyroidism, unspecified: Secondary | ICD-10-CM | POA: Diagnosis not present

## 2017-05-07 DIAGNOSIS — F209 Schizophrenia, unspecified: Secondary | ICD-10-CM | POA: Diagnosis not present

## 2017-05-10 DIAGNOSIS — F4310 Post-traumatic stress disorder, unspecified: Secondary | ICD-10-CM | POA: Diagnosis not present

## 2017-05-10 DIAGNOSIS — E7800 Pure hypercholesterolemia, unspecified: Secondary | ICD-10-CM | POA: Diagnosis not present

## 2017-05-10 DIAGNOSIS — G309 Alzheimer's disease, unspecified: Secondary | ICD-10-CM | POA: Diagnosis not present

## 2017-05-10 DIAGNOSIS — E039 Hypothyroidism, unspecified: Secondary | ICD-10-CM | POA: Diagnosis not present

## 2017-05-10 DIAGNOSIS — F209 Schizophrenia, unspecified: Secondary | ICD-10-CM | POA: Diagnosis not present

## 2017-05-10 DIAGNOSIS — N183 Chronic kidney disease, stage 3 (moderate): Secondary | ICD-10-CM | POA: Diagnosis not present

## 2017-05-11 DIAGNOSIS — F209 Schizophrenia, unspecified: Secondary | ICD-10-CM | POA: Diagnosis not present

## 2017-05-11 DIAGNOSIS — F4310 Post-traumatic stress disorder, unspecified: Secondary | ICD-10-CM | POA: Diagnosis not present

## 2017-05-11 DIAGNOSIS — E039 Hypothyroidism, unspecified: Secondary | ICD-10-CM | POA: Diagnosis not present

## 2017-05-11 DIAGNOSIS — E7800 Pure hypercholesterolemia, unspecified: Secondary | ICD-10-CM | POA: Diagnosis not present

## 2017-05-11 DIAGNOSIS — N183 Chronic kidney disease, stage 3 (moderate): Secondary | ICD-10-CM | POA: Diagnosis not present

## 2017-05-11 DIAGNOSIS — G309 Alzheimer's disease, unspecified: Secondary | ICD-10-CM | POA: Diagnosis not present

## 2017-05-12 DIAGNOSIS — E7800 Pure hypercholesterolemia, unspecified: Secondary | ICD-10-CM | POA: Diagnosis not present

## 2017-05-12 DIAGNOSIS — F4310 Post-traumatic stress disorder, unspecified: Secondary | ICD-10-CM | POA: Diagnosis not present

## 2017-05-12 DIAGNOSIS — G309 Alzheimer's disease, unspecified: Secondary | ICD-10-CM | POA: Diagnosis not present

## 2017-05-12 DIAGNOSIS — N183 Chronic kidney disease, stage 3 (moderate): Secondary | ICD-10-CM | POA: Diagnosis not present

## 2017-05-12 DIAGNOSIS — E039 Hypothyroidism, unspecified: Secondary | ICD-10-CM | POA: Diagnosis not present

## 2017-05-12 DIAGNOSIS — F209 Schizophrenia, unspecified: Secondary | ICD-10-CM | POA: Diagnosis not present

## 2017-05-13 DIAGNOSIS — F4310 Post-traumatic stress disorder, unspecified: Secondary | ICD-10-CM | POA: Diagnosis not present

## 2017-05-13 DIAGNOSIS — G309 Alzheimer's disease, unspecified: Secondary | ICD-10-CM | POA: Diagnosis not present

## 2017-05-13 DIAGNOSIS — E039 Hypothyroidism, unspecified: Secondary | ICD-10-CM | POA: Diagnosis not present

## 2017-05-13 DIAGNOSIS — N183 Chronic kidney disease, stage 3 (moderate): Secondary | ICD-10-CM | POA: Diagnosis not present

## 2017-05-13 DIAGNOSIS — E7800 Pure hypercholesterolemia, unspecified: Secondary | ICD-10-CM | POA: Diagnosis not present

## 2017-05-13 DIAGNOSIS — F209 Schizophrenia, unspecified: Secondary | ICD-10-CM | POA: Diagnosis not present

## 2017-05-14 DIAGNOSIS — E7800 Pure hypercholesterolemia, unspecified: Secondary | ICD-10-CM | POA: Diagnosis not present

## 2017-05-14 DIAGNOSIS — E039 Hypothyroidism, unspecified: Secondary | ICD-10-CM | POA: Diagnosis not present

## 2017-05-14 DIAGNOSIS — F209 Schizophrenia, unspecified: Secondary | ICD-10-CM | POA: Diagnosis not present

## 2017-05-14 DIAGNOSIS — N183 Chronic kidney disease, stage 3 (moderate): Secondary | ICD-10-CM | POA: Diagnosis not present

## 2017-05-14 DIAGNOSIS — F4310 Post-traumatic stress disorder, unspecified: Secondary | ICD-10-CM | POA: Diagnosis not present

## 2017-05-14 DIAGNOSIS — G309 Alzheimer's disease, unspecified: Secondary | ICD-10-CM | POA: Diagnosis not present

## 2017-05-17 DIAGNOSIS — F209 Schizophrenia, unspecified: Secondary | ICD-10-CM | POA: Diagnosis not present

## 2017-05-17 DIAGNOSIS — E7800 Pure hypercholesterolemia, unspecified: Secondary | ICD-10-CM | POA: Diagnosis not present

## 2017-05-17 DIAGNOSIS — F4310 Post-traumatic stress disorder, unspecified: Secondary | ICD-10-CM | POA: Diagnosis not present

## 2017-05-17 DIAGNOSIS — E039 Hypothyroidism, unspecified: Secondary | ICD-10-CM | POA: Diagnosis not present

## 2017-05-17 DIAGNOSIS — G309 Alzheimer's disease, unspecified: Secondary | ICD-10-CM | POA: Diagnosis not present

## 2017-05-17 DIAGNOSIS — N183 Chronic kidney disease, stage 3 (moderate): Secondary | ICD-10-CM | POA: Diagnosis not present

## 2017-05-18 DIAGNOSIS — F4310 Post-traumatic stress disorder, unspecified: Secondary | ICD-10-CM | POA: Diagnosis not present

## 2017-05-18 DIAGNOSIS — F209 Schizophrenia, unspecified: Secondary | ICD-10-CM | POA: Diagnosis not present

## 2017-05-18 DIAGNOSIS — G309 Alzheimer's disease, unspecified: Secondary | ICD-10-CM | POA: Diagnosis not present

## 2017-05-18 DIAGNOSIS — E7800 Pure hypercholesterolemia, unspecified: Secondary | ICD-10-CM | POA: Diagnosis not present

## 2017-05-18 DIAGNOSIS — E039 Hypothyroidism, unspecified: Secondary | ICD-10-CM | POA: Diagnosis not present

## 2017-05-18 DIAGNOSIS — N183 Chronic kidney disease, stage 3 (moderate): Secondary | ICD-10-CM | POA: Diagnosis not present

## 2017-05-19 DIAGNOSIS — G309 Alzheimer's disease, unspecified: Secondary | ICD-10-CM | POA: Diagnosis not present

## 2017-05-19 DIAGNOSIS — E7800 Pure hypercholesterolemia, unspecified: Secondary | ICD-10-CM | POA: Diagnosis not present

## 2017-05-19 DIAGNOSIS — N183 Chronic kidney disease, stage 3 (moderate): Secondary | ICD-10-CM | POA: Diagnosis not present

## 2017-05-19 DIAGNOSIS — F209 Schizophrenia, unspecified: Secondary | ICD-10-CM | POA: Diagnosis not present

## 2017-05-19 DIAGNOSIS — F4310 Post-traumatic stress disorder, unspecified: Secondary | ICD-10-CM | POA: Diagnosis not present

## 2017-05-19 DIAGNOSIS — E039 Hypothyroidism, unspecified: Secondary | ICD-10-CM | POA: Diagnosis not present

## 2017-05-20 DIAGNOSIS — G309 Alzheimer's disease, unspecified: Secondary | ICD-10-CM | POA: Diagnosis not present

## 2017-05-20 DIAGNOSIS — E039 Hypothyroidism, unspecified: Secondary | ICD-10-CM | POA: Diagnosis not present

## 2017-05-20 DIAGNOSIS — F209 Schizophrenia, unspecified: Secondary | ICD-10-CM | POA: Diagnosis not present

## 2017-05-20 DIAGNOSIS — E7800 Pure hypercholesterolemia, unspecified: Secondary | ICD-10-CM | POA: Diagnosis not present

## 2017-05-20 DIAGNOSIS — N183 Chronic kidney disease, stage 3 (moderate): Secondary | ICD-10-CM | POA: Diagnosis not present

## 2017-05-20 DIAGNOSIS — F4310 Post-traumatic stress disorder, unspecified: Secondary | ICD-10-CM | POA: Diagnosis not present

## 2017-05-21 DIAGNOSIS — F209 Schizophrenia, unspecified: Secondary | ICD-10-CM | POA: Diagnosis not present

## 2017-05-21 DIAGNOSIS — G309 Alzheimer's disease, unspecified: Secondary | ICD-10-CM | POA: Diagnosis not present

## 2017-05-21 DIAGNOSIS — F4310 Post-traumatic stress disorder, unspecified: Secondary | ICD-10-CM | POA: Diagnosis not present

## 2017-05-21 DIAGNOSIS — E039 Hypothyroidism, unspecified: Secondary | ICD-10-CM | POA: Diagnosis not present

## 2017-05-21 DIAGNOSIS — E7800 Pure hypercholesterolemia, unspecified: Secondary | ICD-10-CM | POA: Diagnosis not present

## 2017-05-21 DIAGNOSIS — N183 Chronic kidney disease, stage 3 (moderate): Secondary | ICD-10-CM | POA: Diagnosis not present

## 2017-05-24 DIAGNOSIS — F4310 Post-traumatic stress disorder, unspecified: Secondary | ICD-10-CM | POA: Diagnosis not present

## 2017-05-24 DIAGNOSIS — F209 Schizophrenia, unspecified: Secondary | ICD-10-CM | POA: Diagnosis not present

## 2017-05-24 DIAGNOSIS — N183 Chronic kidney disease, stage 3 (moderate): Secondary | ICD-10-CM | POA: Diagnosis not present

## 2017-05-24 DIAGNOSIS — G309 Alzheimer's disease, unspecified: Secondary | ICD-10-CM | POA: Diagnosis not present

## 2017-05-24 DIAGNOSIS — E039 Hypothyroidism, unspecified: Secondary | ICD-10-CM | POA: Diagnosis not present

## 2017-05-24 DIAGNOSIS — E7800 Pure hypercholesterolemia, unspecified: Secondary | ICD-10-CM | POA: Diagnosis not present

## 2017-05-25 DIAGNOSIS — E039 Hypothyroidism, unspecified: Secondary | ICD-10-CM | POA: Diagnosis not present

## 2017-05-25 DIAGNOSIS — G309 Alzheimer's disease, unspecified: Secondary | ICD-10-CM | POA: Diagnosis not present

## 2017-05-25 DIAGNOSIS — E7800 Pure hypercholesterolemia, unspecified: Secondary | ICD-10-CM | POA: Diagnosis not present

## 2017-05-25 DIAGNOSIS — F4310 Post-traumatic stress disorder, unspecified: Secondary | ICD-10-CM | POA: Diagnosis not present

## 2017-05-25 DIAGNOSIS — N183 Chronic kidney disease, stage 3 (moderate): Secondary | ICD-10-CM | POA: Diagnosis not present

## 2017-05-25 DIAGNOSIS — F209 Schizophrenia, unspecified: Secondary | ICD-10-CM | POA: Diagnosis not present

## 2017-05-26 DIAGNOSIS — E7800 Pure hypercholesterolemia, unspecified: Secondary | ICD-10-CM | POA: Diagnosis not present

## 2017-05-26 DIAGNOSIS — N183 Chronic kidney disease, stage 3 (moderate): Secondary | ICD-10-CM | POA: Diagnosis not present

## 2017-05-26 DIAGNOSIS — F4310 Post-traumatic stress disorder, unspecified: Secondary | ICD-10-CM | POA: Diagnosis not present

## 2017-05-26 DIAGNOSIS — F209 Schizophrenia, unspecified: Secondary | ICD-10-CM | POA: Diagnosis not present

## 2017-05-26 DIAGNOSIS — E039 Hypothyroidism, unspecified: Secondary | ICD-10-CM | POA: Diagnosis not present

## 2017-05-26 DIAGNOSIS — G309 Alzheimer's disease, unspecified: Secondary | ICD-10-CM | POA: Diagnosis not present

## 2017-05-27 DIAGNOSIS — N183 Chronic kidney disease, stage 3 (moderate): Secondary | ICD-10-CM | POA: Diagnosis not present

## 2017-05-27 DIAGNOSIS — G309 Alzheimer's disease, unspecified: Secondary | ICD-10-CM | POA: Diagnosis not present

## 2017-05-27 DIAGNOSIS — F4310 Post-traumatic stress disorder, unspecified: Secondary | ICD-10-CM | POA: Diagnosis not present

## 2017-05-27 DIAGNOSIS — F209 Schizophrenia, unspecified: Secondary | ICD-10-CM | POA: Diagnosis not present

## 2017-05-27 DIAGNOSIS — E039 Hypothyroidism, unspecified: Secondary | ICD-10-CM | POA: Diagnosis not present

## 2017-05-27 DIAGNOSIS — E7800 Pure hypercholesterolemia, unspecified: Secondary | ICD-10-CM | POA: Diagnosis not present

## 2017-05-28 DIAGNOSIS — E7800 Pure hypercholesterolemia, unspecified: Secondary | ICD-10-CM | POA: Diagnosis not present

## 2017-05-28 DIAGNOSIS — G309 Alzheimer's disease, unspecified: Secondary | ICD-10-CM | POA: Diagnosis not present

## 2017-05-28 DIAGNOSIS — E039 Hypothyroidism, unspecified: Secondary | ICD-10-CM | POA: Diagnosis not present

## 2017-05-28 DIAGNOSIS — N183 Chronic kidney disease, stage 3 (moderate): Secondary | ICD-10-CM | POA: Diagnosis not present

## 2017-05-28 DIAGNOSIS — F209 Schizophrenia, unspecified: Secondary | ICD-10-CM | POA: Diagnosis not present

## 2017-05-28 DIAGNOSIS — F4310 Post-traumatic stress disorder, unspecified: Secondary | ICD-10-CM | POA: Diagnosis not present

## 2017-05-30 DIAGNOSIS — N183 Chronic kidney disease, stage 3 (moderate): Secondary | ICD-10-CM | POA: Diagnosis not present

## 2017-05-30 DIAGNOSIS — E7800 Pure hypercholesterolemia, unspecified: Secondary | ICD-10-CM | POA: Diagnosis not present

## 2017-05-30 DIAGNOSIS — F4310 Post-traumatic stress disorder, unspecified: Secondary | ICD-10-CM | POA: Diagnosis not present

## 2017-05-30 DIAGNOSIS — G309 Alzheimer's disease, unspecified: Secondary | ICD-10-CM | POA: Diagnosis not present

## 2017-05-30 DIAGNOSIS — F209 Schizophrenia, unspecified: Secondary | ICD-10-CM | POA: Diagnosis not present

## 2017-05-30 DIAGNOSIS — E039 Hypothyroidism, unspecified: Secondary | ICD-10-CM | POA: Diagnosis not present

## 2017-05-31 DIAGNOSIS — F209 Schizophrenia, unspecified: Secondary | ICD-10-CM | POA: Diagnosis not present

## 2017-05-31 DIAGNOSIS — G309 Alzheimer's disease, unspecified: Secondary | ICD-10-CM | POA: Diagnosis not present

## 2017-05-31 DIAGNOSIS — I10 Essential (primary) hypertension: Secondary | ICD-10-CM | POA: Diagnosis not present

## 2017-05-31 DIAGNOSIS — D509 Iron deficiency anemia, unspecified: Secondary | ICD-10-CM | POA: Diagnosis not present

## 2017-05-31 DIAGNOSIS — E559 Vitamin D deficiency, unspecified: Secondary | ICD-10-CM | POA: Diagnosis not present

## 2017-05-31 DIAGNOSIS — E7800 Pure hypercholesterolemia, unspecified: Secondary | ICD-10-CM | POA: Diagnosis not present

## 2017-05-31 DIAGNOSIS — E039 Hypothyroidism, unspecified: Secondary | ICD-10-CM | POA: Diagnosis not present

## 2017-05-31 DIAGNOSIS — N183 Chronic kidney disease, stage 3 (moderate): Secondary | ICD-10-CM | POA: Diagnosis not present

## 2017-05-31 DIAGNOSIS — F4310 Post-traumatic stress disorder, unspecified: Secondary | ICD-10-CM | POA: Diagnosis not present

## 2017-05-31 DIAGNOSIS — E538 Deficiency of other specified B group vitamins: Secondary | ICD-10-CM | POA: Diagnosis not present

## 2017-06-01 DIAGNOSIS — E7800 Pure hypercholesterolemia, unspecified: Secondary | ICD-10-CM | POA: Diagnosis not present

## 2017-06-01 DIAGNOSIS — E039 Hypothyroidism, unspecified: Secondary | ICD-10-CM | POA: Diagnosis not present

## 2017-06-01 DIAGNOSIS — F209 Schizophrenia, unspecified: Secondary | ICD-10-CM | POA: Diagnosis not present

## 2017-06-01 DIAGNOSIS — G309 Alzheimer's disease, unspecified: Secondary | ICD-10-CM | POA: Diagnosis not present

## 2017-06-01 DIAGNOSIS — F4310 Post-traumatic stress disorder, unspecified: Secondary | ICD-10-CM | POA: Diagnosis not present

## 2017-06-01 DIAGNOSIS — N183 Chronic kidney disease, stage 3 (moderate): Secondary | ICD-10-CM | POA: Diagnosis not present

## 2017-06-02 DIAGNOSIS — F4310 Post-traumatic stress disorder, unspecified: Secondary | ICD-10-CM | POA: Diagnosis not present

## 2017-06-02 DIAGNOSIS — N183 Chronic kidney disease, stage 3 (moderate): Secondary | ICD-10-CM | POA: Diagnosis not present

## 2017-06-02 DIAGNOSIS — F209 Schizophrenia, unspecified: Secondary | ICD-10-CM | POA: Diagnosis not present

## 2017-06-02 DIAGNOSIS — G309 Alzheimer's disease, unspecified: Secondary | ICD-10-CM | POA: Diagnosis not present

## 2017-06-02 DIAGNOSIS — E039 Hypothyroidism, unspecified: Secondary | ICD-10-CM | POA: Diagnosis not present

## 2017-06-02 DIAGNOSIS — E7800 Pure hypercholesterolemia, unspecified: Secondary | ICD-10-CM | POA: Diagnosis not present

## 2017-06-03 DIAGNOSIS — F209 Schizophrenia, unspecified: Secondary | ICD-10-CM | POA: Diagnosis not present

## 2017-06-03 DIAGNOSIS — G309 Alzheimer's disease, unspecified: Secondary | ICD-10-CM | POA: Diagnosis not present

## 2017-06-03 DIAGNOSIS — N183 Chronic kidney disease, stage 3 (moderate): Secondary | ICD-10-CM | POA: Diagnosis not present

## 2017-06-03 DIAGNOSIS — E039 Hypothyroidism, unspecified: Secondary | ICD-10-CM | POA: Diagnosis not present

## 2017-06-03 DIAGNOSIS — F4310 Post-traumatic stress disorder, unspecified: Secondary | ICD-10-CM | POA: Diagnosis not present

## 2017-06-03 DIAGNOSIS — E7800 Pure hypercholesterolemia, unspecified: Secondary | ICD-10-CM | POA: Diagnosis not present

## 2017-06-04 DIAGNOSIS — G309 Alzheimer's disease, unspecified: Secondary | ICD-10-CM | POA: Diagnosis not present

## 2017-06-04 DIAGNOSIS — F209 Schizophrenia, unspecified: Secondary | ICD-10-CM | POA: Diagnosis not present

## 2017-06-04 DIAGNOSIS — E7800 Pure hypercholesterolemia, unspecified: Secondary | ICD-10-CM | POA: Diagnosis not present

## 2017-06-04 DIAGNOSIS — N183 Chronic kidney disease, stage 3 (moderate): Secondary | ICD-10-CM | POA: Diagnosis not present

## 2017-06-04 DIAGNOSIS — F4310 Post-traumatic stress disorder, unspecified: Secondary | ICD-10-CM | POA: Diagnosis not present

## 2017-06-04 DIAGNOSIS — E039 Hypothyroidism, unspecified: Secondary | ICD-10-CM | POA: Diagnosis not present

## 2017-06-07 DIAGNOSIS — E7800 Pure hypercholesterolemia, unspecified: Secondary | ICD-10-CM | POA: Diagnosis not present

## 2017-06-07 DIAGNOSIS — E039 Hypothyroidism, unspecified: Secondary | ICD-10-CM | POA: Diagnosis not present

## 2017-06-07 DIAGNOSIS — F4310 Post-traumatic stress disorder, unspecified: Secondary | ICD-10-CM | POA: Diagnosis not present

## 2017-06-07 DIAGNOSIS — N183 Chronic kidney disease, stage 3 (moderate): Secondary | ICD-10-CM | POA: Diagnosis not present

## 2017-06-07 DIAGNOSIS — G309 Alzheimer's disease, unspecified: Secondary | ICD-10-CM | POA: Diagnosis not present

## 2017-06-07 DIAGNOSIS — F209 Schizophrenia, unspecified: Secondary | ICD-10-CM | POA: Diagnosis not present

## 2017-06-08 DIAGNOSIS — F4310 Post-traumatic stress disorder, unspecified: Secondary | ICD-10-CM | POA: Diagnosis not present

## 2017-06-08 DIAGNOSIS — F209 Schizophrenia, unspecified: Secondary | ICD-10-CM | POA: Diagnosis not present

## 2017-06-08 DIAGNOSIS — N183 Chronic kidney disease, stage 3 (moderate): Secondary | ICD-10-CM | POA: Diagnosis not present

## 2017-06-08 DIAGNOSIS — E039 Hypothyroidism, unspecified: Secondary | ICD-10-CM | POA: Diagnosis not present

## 2017-06-08 DIAGNOSIS — E7800 Pure hypercholesterolemia, unspecified: Secondary | ICD-10-CM | POA: Diagnosis not present

## 2017-06-08 DIAGNOSIS — G309 Alzheimer's disease, unspecified: Secondary | ICD-10-CM | POA: Diagnosis not present

## 2017-06-09 DIAGNOSIS — E7800 Pure hypercholesterolemia, unspecified: Secondary | ICD-10-CM | POA: Diagnosis not present

## 2017-06-09 DIAGNOSIS — F4310 Post-traumatic stress disorder, unspecified: Secondary | ICD-10-CM | POA: Diagnosis not present

## 2017-06-09 DIAGNOSIS — G309 Alzheimer's disease, unspecified: Secondary | ICD-10-CM | POA: Diagnosis not present

## 2017-06-09 DIAGNOSIS — N183 Chronic kidney disease, stage 3 (moderate): Secondary | ICD-10-CM | POA: Diagnosis not present

## 2017-06-09 DIAGNOSIS — E039 Hypothyroidism, unspecified: Secondary | ICD-10-CM | POA: Diagnosis not present

## 2017-06-09 DIAGNOSIS — F209 Schizophrenia, unspecified: Secondary | ICD-10-CM | POA: Diagnosis not present

## 2017-06-10 DIAGNOSIS — N183 Chronic kidney disease, stage 3 (moderate): Secondary | ICD-10-CM | POA: Diagnosis not present

## 2017-06-10 DIAGNOSIS — F209 Schizophrenia, unspecified: Secondary | ICD-10-CM | POA: Diagnosis not present

## 2017-06-10 DIAGNOSIS — E7800 Pure hypercholesterolemia, unspecified: Secondary | ICD-10-CM | POA: Diagnosis not present

## 2017-06-10 DIAGNOSIS — F4310 Post-traumatic stress disorder, unspecified: Secondary | ICD-10-CM | POA: Diagnosis not present

## 2017-06-10 DIAGNOSIS — E039 Hypothyroidism, unspecified: Secondary | ICD-10-CM | POA: Diagnosis not present

## 2017-06-10 DIAGNOSIS — G309 Alzheimer's disease, unspecified: Secondary | ICD-10-CM | POA: Diagnosis not present

## 2017-06-11 DIAGNOSIS — E039 Hypothyroidism, unspecified: Secondary | ICD-10-CM | POA: Diagnosis not present

## 2017-06-11 DIAGNOSIS — F209 Schizophrenia, unspecified: Secondary | ICD-10-CM | POA: Diagnosis not present

## 2017-06-11 DIAGNOSIS — E7800 Pure hypercholesterolemia, unspecified: Secondary | ICD-10-CM | POA: Diagnosis not present

## 2017-06-11 DIAGNOSIS — N183 Chronic kidney disease, stage 3 (moderate): Secondary | ICD-10-CM | POA: Diagnosis not present

## 2017-06-11 DIAGNOSIS — G309 Alzheimer's disease, unspecified: Secondary | ICD-10-CM | POA: Diagnosis not present

## 2017-06-11 DIAGNOSIS — F4310 Post-traumatic stress disorder, unspecified: Secondary | ICD-10-CM | POA: Diagnosis not present

## 2017-06-14 DIAGNOSIS — E7800 Pure hypercholesterolemia, unspecified: Secondary | ICD-10-CM | POA: Diagnosis not present

## 2017-06-14 DIAGNOSIS — E039 Hypothyroidism, unspecified: Secondary | ICD-10-CM | POA: Diagnosis not present

## 2017-06-14 DIAGNOSIS — F209 Schizophrenia, unspecified: Secondary | ICD-10-CM | POA: Diagnosis not present

## 2017-06-14 DIAGNOSIS — G309 Alzheimer's disease, unspecified: Secondary | ICD-10-CM | POA: Diagnosis not present

## 2017-06-14 DIAGNOSIS — N183 Chronic kidney disease, stage 3 (moderate): Secondary | ICD-10-CM | POA: Diagnosis not present

## 2017-06-14 DIAGNOSIS — F4310 Post-traumatic stress disorder, unspecified: Secondary | ICD-10-CM | POA: Diagnosis not present

## 2017-06-15 DIAGNOSIS — E7800 Pure hypercholesterolemia, unspecified: Secondary | ICD-10-CM | POA: Diagnosis not present

## 2017-06-15 DIAGNOSIS — G309 Alzheimer's disease, unspecified: Secondary | ICD-10-CM | POA: Diagnosis not present

## 2017-06-15 DIAGNOSIS — N183 Chronic kidney disease, stage 3 (moderate): Secondary | ICD-10-CM | POA: Diagnosis not present

## 2017-06-15 DIAGNOSIS — F209 Schizophrenia, unspecified: Secondary | ICD-10-CM | POA: Diagnosis not present

## 2017-06-15 DIAGNOSIS — E039 Hypothyroidism, unspecified: Secondary | ICD-10-CM | POA: Diagnosis not present

## 2017-06-15 DIAGNOSIS — F4310 Post-traumatic stress disorder, unspecified: Secondary | ICD-10-CM | POA: Diagnosis not present

## 2017-06-16 DIAGNOSIS — N183 Chronic kidney disease, stage 3 (moderate): Secondary | ICD-10-CM | POA: Diagnosis not present

## 2017-06-16 DIAGNOSIS — F4310 Post-traumatic stress disorder, unspecified: Secondary | ICD-10-CM | POA: Diagnosis not present

## 2017-06-16 DIAGNOSIS — F209 Schizophrenia, unspecified: Secondary | ICD-10-CM | POA: Diagnosis not present

## 2017-06-16 DIAGNOSIS — E7800 Pure hypercholesterolemia, unspecified: Secondary | ICD-10-CM | POA: Diagnosis not present

## 2017-06-16 DIAGNOSIS — G309 Alzheimer's disease, unspecified: Secondary | ICD-10-CM | POA: Diagnosis not present

## 2017-06-16 DIAGNOSIS — E039 Hypothyroidism, unspecified: Secondary | ICD-10-CM | POA: Diagnosis not present

## 2017-06-17 DIAGNOSIS — G309 Alzheimer's disease, unspecified: Secondary | ICD-10-CM | POA: Diagnosis not present

## 2017-06-17 DIAGNOSIS — F4310 Post-traumatic stress disorder, unspecified: Secondary | ICD-10-CM | POA: Diagnosis not present

## 2017-06-17 DIAGNOSIS — N183 Chronic kidney disease, stage 3 (moderate): Secondary | ICD-10-CM | POA: Diagnosis not present

## 2017-06-17 DIAGNOSIS — F209 Schizophrenia, unspecified: Secondary | ICD-10-CM | POA: Diagnosis not present

## 2017-06-17 DIAGNOSIS — E039 Hypothyroidism, unspecified: Secondary | ICD-10-CM | POA: Diagnosis not present

## 2017-06-17 DIAGNOSIS — E7800 Pure hypercholesterolemia, unspecified: Secondary | ICD-10-CM | POA: Diagnosis not present

## 2017-06-18 DIAGNOSIS — G309 Alzheimer's disease, unspecified: Secondary | ICD-10-CM | POA: Diagnosis not present

## 2017-06-18 DIAGNOSIS — N183 Chronic kidney disease, stage 3 (moderate): Secondary | ICD-10-CM | POA: Diagnosis not present

## 2017-06-18 DIAGNOSIS — E7800 Pure hypercholesterolemia, unspecified: Secondary | ICD-10-CM | POA: Diagnosis not present

## 2017-06-18 DIAGNOSIS — F209 Schizophrenia, unspecified: Secondary | ICD-10-CM | POA: Diagnosis not present

## 2017-06-18 DIAGNOSIS — F4310 Post-traumatic stress disorder, unspecified: Secondary | ICD-10-CM | POA: Diagnosis not present

## 2017-06-18 DIAGNOSIS — E039 Hypothyroidism, unspecified: Secondary | ICD-10-CM | POA: Diagnosis not present

## 2017-06-21 DIAGNOSIS — G309 Alzheimer's disease, unspecified: Secondary | ICD-10-CM | POA: Diagnosis not present

## 2017-06-21 DIAGNOSIS — N183 Chronic kidney disease, stage 3 (moderate): Secondary | ICD-10-CM | POA: Diagnosis not present

## 2017-06-21 DIAGNOSIS — E039 Hypothyroidism, unspecified: Secondary | ICD-10-CM | POA: Diagnosis not present

## 2017-06-21 DIAGNOSIS — E7800 Pure hypercholesterolemia, unspecified: Secondary | ICD-10-CM | POA: Diagnosis not present

## 2017-06-21 DIAGNOSIS — F4310 Post-traumatic stress disorder, unspecified: Secondary | ICD-10-CM | POA: Diagnosis not present

## 2017-06-21 DIAGNOSIS — F209 Schizophrenia, unspecified: Secondary | ICD-10-CM | POA: Diagnosis not present

## 2017-06-22 DIAGNOSIS — F4310 Post-traumatic stress disorder, unspecified: Secondary | ICD-10-CM | POA: Diagnosis not present

## 2017-06-22 DIAGNOSIS — N183 Chronic kidney disease, stage 3 (moderate): Secondary | ICD-10-CM | POA: Diagnosis not present

## 2017-06-22 DIAGNOSIS — E039 Hypothyroidism, unspecified: Secondary | ICD-10-CM | POA: Diagnosis not present

## 2017-06-22 DIAGNOSIS — G309 Alzheimer's disease, unspecified: Secondary | ICD-10-CM | POA: Diagnosis not present

## 2017-06-22 DIAGNOSIS — E7800 Pure hypercholesterolemia, unspecified: Secondary | ICD-10-CM | POA: Diagnosis not present

## 2017-06-22 DIAGNOSIS — F209 Schizophrenia, unspecified: Secondary | ICD-10-CM | POA: Diagnosis not present

## 2017-06-23 DIAGNOSIS — F4310 Post-traumatic stress disorder, unspecified: Secondary | ICD-10-CM | POA: Diagnosis not present

## 2017-06-23 DIAGNOSIS — G309 Alzheimer's disease, unspecified: Secondary | ICD-10-CM | POA: Diagnosis not present

## 2017-06-23 DIAGNOSIS — F209 Schizophrenia, unspecified: Secondary | ICD-10-CM | POA: Diagnosis not present

## 2017-06-23 DIAGNOSIS — N183 Chronic kidney disease, stage 3 (moderate): Secondary | ICD-10-CM | POA: Diagnosis not present

## 2017-06-23 DIAGNOSIS — E039 Hypothyroidism, unspecified: Secondary | ICD-10-CM | POA: Diagnosis not present

## 2017-06-23 DIAGNOSIS — E7800 Pure hypercholesterolemia, unspecified: Secondary | ICD-10-CM | POA: Diagnosis not present

## 2017-06-24 DIAGNOSIS — F209 Schizophrenia, unspecified: Secondary | ICD-10-CM | POA: Diagnosis not present

## 2017-06-24 DIAGNOSIS — N183 Chronic kidney disease, stage 3 (moderate): Secondary | ICD-10-CM | POA: Diagnosis not present

## 2017-06-24 DIAGNOSIS — G309 Alzheimer's disease, unspecified: Secondary | ICD-10-CM | POA: Diagnosis not present

## 2017-06-24 DIAGNOSIS — E7800 Pure hypercholesterolemia, unspecified: Secondary | ICD-10-CM | POA: Diagnosis not present

## 2017-06-24 DIAGNOSIS — E039 Hypothyroidism, unspecified: Secondary | ICD-10-CM | POA: Diagnosis not present

## 2017-06-24 DIAGNOSIS — F4310 Post-traumatic stress disorder, unspecified: Secondary | ICD-10-CM | POA: Diagnosis not present

## 2017-06-25 DIAGNOSIS — E7800 Pure hypercholesterolemia, unspecified: Secondary | ICD-10-CM | POA: Diagnosis not present

## 2017-06-25 DIAGNOSIS — F4310 Post-traumatic stress disorder, unspecified: Secondary | ICD-10-CM | POA: Diagnosis not present

## 2017-06-25 DIAGNOSIS — F209 Schizophrenia, unspecified: Secondary | ICD-10-CM | POA: Diagnosis not present

## 2017-06-25 DIAGNOSIS — N183 Chronic kidney disease, stage 3 (moderate): Secondary | ICD-10-CM | POA: Diagnosis not present

## 2017-06-25 DIAGNOSIS — G309 Alzheimer's disease, unspecified: Secondary | ICD-10-CM | POA: Diagnosis not present

## 2017-06-25 DIAGNOSIS — E039 Hypothyroidism, unspecified: Secondary | ICD-10-CM | POA: Diagnosis not present

## 2017-06-28 DIAGNOSIS — E7800 Pure hypercholesterolemia, unspecified: Secondary | ICD-10-CM | POA: Diagnosis not present

## 2017-06-28 DIAGNOSIS — F209 Schizophrenia, unspecified: Secondary | ICD-10-CM | POA: Diagnosis not present

## 2017-06-28 DIAGNOSIS — N183 Chronic kidney disease, stage 3 (moderate): Secondary | ICD-10-CM | POA: Diagnosis not present

## 2017-06-28 DIAGNOSIS — G309 Alzheimer's disease, unspecified: Secondary | ICD-10-CM | POA: Diagnosis not present

## 2017-06-28 DIAGNOSIS — E039 Hypothyroidism, unspecified: Secondary | ICD-10-CM | POA: Diagnosis not present

## 2017-06-28 DIAGNOSIS — F4310 Post-traumatic stress disorder, unspecified: Secondary | ICD-10-CM | POA: Diagnosis not present

## 2017-06-29 DIAGNOSIS — E7800 Pure hypercholesterolemia, unspecified: Secondary | ICD-10-CM | POA: Diagnosis not present

## 2017-06-29 DIAGNOSIS — N183 Chronic kidney disease, stage 3 (moderate): Secondary | ICD-10-CM | POA: Diagnosis not present

## 2017-06-29 DIAGNOSIS — F4310 Post-traumatic stress disorder, unspecified: Secondary | ICD-10-CM | POA: Diagnosis not present

## 2017-06-29 DIAGNOSIS — E039 Hypothyroidism, unspecified: Secondary | ICD-10-CM | POA: Diagnosis not present

## 2017-06-29 DIAGNOSIS — F209 Schizophrenia, unspecified: Secondary | ICD-10-CM | POA: Diagnosis not present

## 2017-06-29 DIAGNOSIS — G309 Alzheimer's disease, unspecified: Secondary | ICD-10-CM | POA: Diagnosis not present

## 2017-06-30 DIAGNOSIS — E039 Hypothyroidism, unspecified: Secondary | ICD-10-CM | POA: Diagnosis not present

## 2017-06-30 DIAGNOSIS — F4310 Post-traumatic stress disorder, unspecified: Secondary | ICD-10-CM | POA: Diagnosis not present

## 2017-06-30 DIAGNOSIS — E7800 Pure hypercholesterolemia, unspecified: Secondary | ICD-10-CM | POA: Diagnosis not present

## 2017-06-30 DIAGNOSIS — F209 Schizophrenia, unspecified: Secondary | ICD-10-CM | POA: Diagnosis not present

## 2017-06-30 DIAGNOSIS — N183 Chronic kidney disease, stage 3 (moderate): Secondary | ICD-10-CM | POA: Diagnosis not present

## 2017-06-30 DIAGNOSIS — I10 Essential (primary) hypertension: Secondary | ICD-10-CM | POA: Diagnosis not present

## 2017-06-30 DIAGNOSIS — E559 Vitamin D deficiency, unspecified: Secondary | ICD-10-CM | POA: Diagnosis not present

## 2017-06-30 DIAGNOSIS — D509 Iron deficiency anemia, unspecified: Secondary | ICD-10-CM | POA: Diagnosis not present

## 2017-06-30 DIAGNOSIS — E538 Deficiency of other specified B group vitamins: Secondary | ICD-10-CM | POA: Diagnosis not present

## 2017-06-30 DIAGNOSIS — G309 Alzheimer's disease, unspecified: Secondary | ICD-10-CM | POA: Diagnosis not present

## 2017-07-01 DIAGNOSIS — N183 Chronic kidney disease, stage 3 (moderate): Secondary | ICD-10-CM | POA: Diagnosis not present

## 2017-07-01 DIAGNOSIS — F209 Schizophrenia, unspecified: Secondary | ICD-10-CM | POA: Diagnosis not present

## 2017-07-01 DIAGNOSIS — E7800 Pure hypercholesterolemia, unspecified: Secondary | ICD-10-CM | POA: Diagnosis not present

## 2017-07-01 DIAGNOSIS — F4310 Post-traumatic stress disorder, unspecified: Secondary | ICD-10-CM | POA: Diagnosis not present

## 2017-07-01 DIAGNOSIS — G309 Alzheimer's disease, unspecified: Secondary | ICD-10-CM | POA: Diagnosis not present

## 2017-07-01 DIAGNOSIS — E039 Hypothyroidism, unspecified: Secondary | ICD-10-CM | POA: Diagnosis not present

## 2017-07-02 DIAGNOSIS — G309 Alzheimer's disease, unspecified: Secondary | ICD-10-CM | POA: Diagnosis not present

## 2017-07-02 DIAGNOSIS — N183 Chronic kidney disease, stage 3 (moderate): Secondary | ICD-10-CM | POA: Diagnosis not present

## 2017-07-02 DIAGNOSIS — E7800 Pure hypercholesterolemia, unspecified: Secondary | ICD-10-CM | POA: Diagnosis not present

## 2017-07-02 DIAGNOSIS — F4310 Post-traumatic stress disorder, unspecified: Secondary | ICD-10-CM | POA: Diagnosis not present

## 2017-07-02 DIAGNOSIS — F209 Schizophrenia, unspecified: Secondary | ICD-10-CM | POA: Diagnosis not present

## 2017-07-02 DIAGNOSIS — E039 Hypothyroidism, unspecified: Secondary | ICD-10-CM | POA: Diagnosis not present

## 2017-07-05 DIAGNOSIS — N183 Chronic kidney disease, stage 3 (moderate): Secondary | ICD-10-CM | POA: Diagnosis not present

## 2017-07-05 DIAGNOSIS — E7800 Pure hypercholesterolemia, unspecified: Secondary | ICD-10-CM | POA: Diagnosis not present

## 2017-07-05 DIAGNOSIS — F4310 Post-traumatic stress disorder, unspecified: Secondary | ICD-10-CM | POA: Diagnosis not present

## 2017-07-05 DIAGNOSIS — G309 Alzheimer's disease, unspecified: Secondary | ICD-10-CM | POA: Diagnosis not present

## 2017-07-05 DIAGNOSIS — F209 Schizophrenia, unspecified: Secondary | ICD-10-CM | POA: Diagnosis not present

## 2017-07-05 DIAGNOSIS — E039 Hypothyroidism, unspecified: Secondary | ICD-10-CM | POA: Diagnosis not present

## 2017-07-06 DIAGNOSIS — F209 Schizophrenia, unspecified: Secondary | ICD-10-CM | POA: Diagnosis not present

## 2017-07-06 DIAGNOSIS — N183 Chronic kidney disease, stage 3 (moderate): Secondary | ICD-10-CM | POA: Diagnosis not present

## 2017-07-06 DIAGNOSIS — E7800 Pure hypercholesterolemia, unspecified: Secondary | ICD-10-CM | POA: Diagnosis not present

## 2017-07-06 DIAGNOSIS — G309 Alzheimer's disease, unspecified: Secondary | ICD-10-CM | POA: Diagnosis not present

## 2017-07-06 DIAGNOSIS — E039 Hypothyroidism, unspecified: Secondary | ICD-10-CM | POA: Diagnosis not present

## 2017-07-06 DIAGNOSIS — F4310 Post-traumatic stress disorder, unspecified: Secondary | ICD-10-CM | POA: Diagnosis not present

## 2017-07-07 DIAGNOSIS — F4310 Post-traumatic stress disorder, unspecified: Secondary | ICD-10-CM | POA: Diagnosis not present

## 2017-07-07 DIAGNOSIS — F209 Schizophrenia, unspecified: Secondary | ICD-10-CM | POA: Diagnosis not present

## 2017-07-07 DIAGNOSIS — E039 Hypothyroidism, unspecified: Secondary | ICD-10-CM | POA: Diagnosis not present

## 2017-07-07 DIAGNOSIS — E7800 Pure hypercholesterolemia, unspecified: Secondary | ICD-10-CM | POA: Diagnosis not present

## 2017-07-07 DIAGNOSIS — G309 Alzheimer's disease, unspecified: Secondary | ICD-10-CM | POA: Diagnosis not present

## 2017-07-07 DIAGNOSIS — N183 Chronic kidney disease, stage 3 (moderate): Secondary | ICD-10-CM | POA: Diagnosis not present

## 2017-07-08 DIAGNOSIS — F4310 Post-traumatic stress disorder, unspecified: Secondary | ICD-10-CM | POA: Diagnosis not present

## 2017-07-08 DIAGNOSIS — N183 Chronic kidney disease, stage 3 (moderate): Secondary | ICD-10-CM | POA: Diagnosis not present

## 2017-07-08 DIAGNOSIS — F209 Schizophrenia, unspecified: Secondary | ICD-10-CM | POA: Diagnosis not present

## 2017-07-08 DIAGNOSIS — G309 Alzheimer's disease, unspecified: Secondary | ICD-10-CM | POA: Diagnosis not present

## 2017-07-08 DIAGNOSIS — E7800 Pure hypercholesterolemia, unspecified: Secondary | ICD-10-CM | POA: Diagnosis not present

## 2017-07-08 DIAGNOSIS — E039 Hypothyroidism, unspecified: Secondary | ICD-10-CM | POA: Diagnosis not present

## 2017-07-09 DIAGNOSIS — E7800 Pure hypercholesterolemia, unspecified: Secondary | ICD-10-CM | POA: Diagnosis not present

## 2017-07-09 DIAGNOSIS — E039 Hypothyroidism, unspecified: Secondary | ICD-10-CM | POA: Diagnosis not present

## 2017-07-09 DIAGNOSIS — F4310 Post-traumatic stress disorder, unspecified: Secondary | ICD-10-CM | POA: Diagnosis not present

## 2017-07-09 DIAGNOSIS — F209 Schizophrenia, unspecified: Secondary | ICD-10-CM | POA: Diagnosis not present

## 2017-07-09 DIAGNOSIS — G309 Alzheimer's disease, unspecified: Secondary | ICD-10-CM | POA: Diagnosis not present

## 2017-07-09 DIAGNOSIS — N183 Chronic kidney disease, stage 3 (moderate): Secondary | ICD-10-CM | POA: Diagnosis not present

## 2017-07-12 DIAGNOSIS — N183 Chronic kidney disease, stage 3 (moderate): Secondary | ICD-10-CM | POA: Diagnosis not present

## 2017-07-12 DIAGNOSIS — E039 Hypothyroidism, unspecified: Secondary | ICD-10-CM | POA: Diagnosis not present

## 2017-07-12 DIAGNOSIS — F4310 Post-traumatic stress disorder, unspecified: Secondary | ICD-10-CM | POA: Diagnosis not present

## 2017-07-12 DIAGNOSIS — F209 Schizophrenia, unspecified: Secondary | ICD-10-CM | POA: Diagnosis not present

## 2017-07-12 DIAGNOSIS — E7800 Pure hypercholesterolemia, unspecified: Secondary | ICD-10-CM | POA: Diagnosis not present

## 2017-07-12 DIAGNOSIS — G309 Alzheimer's disease, unspecified: Secondary | ICD-10-CM | POA: Diagnosis not present

## 2017-07-13 DIAGNOSIS — F4310 Post-traumatic stress disorder, unspecified: Secondary | ICD-10-CM | POA: Diagnosis not present

## 2017-07-13 DIAGNOSIS — G309 Alzheimer's disease, unspecified: Secondary | ICD-10-CM | POA: Diagnosis not present

## 2017-07-13 DIAGNOSIS — N183 Chronic kidney disease, stage 3 (moderate): Secondary | ICD-10-CM | POA: Diagnosis not present

## 2017-07-13 DIAGNOSIS — E039 Hypothyroidism, unspecified: Secondary | ICD-10-CM | POA: Diagnosis not present

## 2017-07-13 DIAGNOSIS — F209 Schizophrenia, unspecified: Secondary | ICD-10-CM | POA: Diagnosis not present

## 2017-07-13 DIAGNOSIS — E7800 Pure hypercholesterolemia, unspecified: Secondary | ICD-10-CM | POA: Diagnosis not present

## 2017-07-14 DIAGNOSIS — E7800 Pure hypercholesterolemia, unspecified: Secondary | ICD-10-CM | POA: Diagnosis not present

## 2017-07-14 DIAGNOSIS — F4310 Post-traumatic stress disorder, unspecified: Secondary | ICD-10-CM | POA: Diagnosis not present

## 2017-07-14 DIAGNOSIS — G309 Alzheimer's disease, unspecified: Secondary | ICD-10-CM | POA: Diagnosis not present

## 2017-07-14 DIAGNOSIS — N183 Chronic kidney disease, stage 3 (moderate): Secondary | ICD-10-CM | POA: Diagnosis not present

## 2017-07-14 DIAGNOSIS — F209 Schizophrenia, unspecified: Secondary | ICD-10-CM | POA: Diagnosis not present

## 2017-07-14 DIAGNOSIS — E039 Hypothyroidism, unspecified: Secondary | ICD-10-CM | POA: Diagnosis not present

## 2017-07-15 DIAGNOSIS — N183 Chronic kidney disease, stage 3 (moderate): Secondary | ICD-10-CM | POA: Diagnosis not present

## 2017-07-15 DIAGNOSIS — F4310 Post-traumatic stress disorder, unspecified: Secondary | ICD-10-CM | POA: Diagnosis not present

## 2017-07-15 DIAGNOSIS — E039 Hypothyroidism, unspecified: Secondary | ICD-10-CM | POA: Diagnosis not present

## 2017-07-15 DIAGNOSIS — G309 Alzheimer's disease, unspecified: Secondary | ICD-10-CM | POA: Diagnosis not present

## 2017-07-15 DIAGNOSIS — F209 Schizophrenia, unspecified: Secondary | ICD-10-CM | POA: Diagnosis not present

## 2017-07-15 DIAGNOSIS — E7800 Pure hypercholesterolemia, unspecified: Secondary | ICD-10-CM | POA: Diagnosis not present

## 2017-07-16 DIAGNOSIS — E039 Hypothyroidism, unspecified: Secondary | ICD-10-CM | POA: Diagnosis not present

## 2017-07-16 DIAGNOSIS — F209 Schizophrenia, unspecified: Secondary | ICD-10-CM | POA: Diagnosis not present

## 2017-07-16 DIAGNOSIS — N183 Chronic kidney disease, stage 3 (moderate): Secondary | ICD-10-CM | POA: Diagnosis not present

## 2017-07-16 DIAGNOSIS — G309 Alzheimer's disease, unspecified: Secondary | ICD-10-CM | POA: Diagnosis not present

## 2017-07-16 DIAGNOSIS — E7800 Pure hypercholesterolemia, unspecified: Secondary | ICD-10-CM | POA: Diagnosis not present

## 2017-07-16 DIAGNOSIS — F4310 Post-traumatic stress disorder, unspecified: Secondary | ICD-10-CM | POA: Diagnosis not present

## 2017-07-18 DIAGNOSIS — E039 Hypothyroidism, unspecified: Secondary | ICD-10-CM | POA: Diagnosis not present

## 2017-07-18 DIAGNOSIS — F4310 Post-traumatic stress disorder, unspecified: Secondary | ICD-10-CM | POA: Diagnosis not present

## 2017-07-18 DIAGNOSIS — E7800 Pure hypercholesterolemia, unspecified: Secondary | ICD-10-CM | POA: Diagnosis not present

## 2017-07-18 DIAGNOSIS — G309 Alzheimer's disease, unspecified: Secondary | ICD-10-CM | POA: Diagnosis not present

## 2017-07-18 DIAGNOSIS — N183 Chronic kidney disease, stage 3 (moderate): Secondary | ICD-10-CM | POA: Diagnosis not present

## 2017-07-18 DIAGNOSIS — F209 Schizophrenia, unspecified: Secondary | ICD-10-CM | POA: Diagnosis not present

## 2017-07-19 DIAGNOSIS — N183 Chronic kidney disease, stage 3 (moderate): Secondary | ICD-10-CM | POA: Diagnosis not present

## 2017-07-19 DIAGNOSIS — F209 Schizophrenia, unspecified: Secondary | ICD-10-CM | POA: Diagnosis not present

## 2017-07-19 DIAGNOSIS — F4310 Post-traumatic stress disorder, unspecified: Secondary | ICD-10-CM | POA: Diagnosis not present

## 2017-07-19 DIAGNOSIS — E039 Hypothyroidism, unspecified: Secondary | ICD-10-CM | POA: Diagnosis not present

## 2017-07-19 DIAGNOSIS — E7800 Pure hypercholesterolemia, unspecified: Secondary | ICD-10-CM | POA: Diagnosis not present

## 2017-07-19 DIAGNOSIS — G309 Alzheimer's disease, unspecified: Secondary | ICD-10-CM | POA: Diagnosis not present

## 2017-07-20 DIAGNOSIS — E039 Hypothyroidism, unspecified: Secondary | ICD-10-CM | POA: Diagnosis not present

## 2017-07-20 DIAGNOSIS — N183 Chronic kidney disease, stage 3 (moderate): Secondary | ICD-10-CM | POA: Diagnosis not present

## 2017-07-20 DIAGNOSIS — G309 Alzheimer's disease, unspecified: Secondary | ICD-10-CM | POA: Diagnosis not present

## 2017-07-20 DIAGNOSIS — E7800 Pure hypercholesterolemia, unspecified: Secondary | ICD-10-CM | POA: Diagnosis not present

## 2017-07-20 DIAGNOSIS — F209 Schizophrenia, unspecified: Secondary | ICD-10-CM | POA: Diagnosis not present

## 2017-07-20 DIAGNOSIS — F4310 Post-traumatic stress disorder, unspecified: Secondary | ICD-10-CM | POA: Diagnosis not present

## 2017-07-21 DIAGNOSIS — N183 Chronic kidney disease, stage 3 (moderate): Secondary | ICD-10-CM | POA: Diagnosis not present

## 2017-07-21 DIAGNOSIS — E7800 Pure hypercholesterolemia, unspecified: Secondary | ICD-10-CM | POA: Diagnosis not present

## 2017-07-21 DIAGNOSIS — G309 Alzheimer's disease, unspecified: Secondary | ICD-10-CM | POA: Diagnosis not present

## 2017-07-21 DIAGNOSIS — E039 Hypothyroidism, unspecified: Secondary | ICD-10-CM | POA: Diagnosis not present

## 2017-07-21 DIAGNOSIS — F4310 Post-traumatic stress disorder, unspecified: Secondary | ICD-10-CM | POA: Diagnosis not present

## 2017-07-21 DIAGNOSIS — F209 Schizophrenia, unspecified: Secondary | ICD-10-CM | POA: Diagnosis not present

## 2017-07-22 DIAGNOSIS — E039 Hypothyroidism, unspecified: Secondary | ICD-10-CM | POA: Diagnosis not present

## 2017-07-22 DIAGNOSIS — F209 Schizophrenia, unspecified: Secondary | ICD-10-CM | POA: Diagnosis not present

## 2017-07-22 DIAGNOSIS — F4310 Post-traumatic stress disorder, unspecified: Secondary | ICD-10-CM | POA: Diagnosis not present

## 2017-07-22 DIAGNOSIS — E7800 Pure hypercholesterolemia, unspecified: Secondary | ICD-10-CM | POA: Diagnosis not present

## 2017-07-22 DIAGNOSIS — G309 Alzheimer's disease, unspecified: Secondary | ICD-10-CM | POA: Diagnosis not present

## 2017-07-22 DIAGNOSIS — N183 Chronic kidney disease, stage 3 (moderate): Secondary | ICD-10-CM | POA: Diagnosis not present

## 2017-07-23 DIAGNOSIS — G309 Alzheimer's disease, unspecified: Secondary | ICD-10-CM | POA: Diagnosis not present

## 2017-07-23 DIAGNOSIS — F4310 Post-traumatic stress disorder, unspecified: Secondary | ICD-10-CM | POA: Diagnosis not present

## 2017-07-23 DIAGNOSIS — N183 Chronic kidney disease, stage 3 (moderate): Secondary | ICD-10-CM | POA: Diagnosis not present

## 2017-07-23 DIAGNOSIS — F209 Schizophrenia, unspecified: Secondary | ICD-10-CM | POA: Diagnosis not present

## 2017-07-23 DIAGNOSIS — E039 Hypothyroidism, unspecified: Secondary | ICD-10-CM | POA: Diagnosis not present

## 2017-07-23 DIAGNOSIS — E7800 Pure hypercholesterolemia, unspecified: Secondary | ICD-10-CM | POA: Diagnosis not present

## 2017-07-27 DIAGNOSIS — F209 Schizophrenia, unspecified: Secondary | ICD-10-CM | POA: Diagnosis not present

## 2017-07-27 DIAGNOSIS — E7800 Pure hypercholesterolemia, unspecified: Secondary | ICD-10-CM | POA: Diagnosis not present

## 2017-07-27 DIAGNOSIS — F4310 Post-traumatic stress disorder, unspecified: Secondary | ICD-10-CM | POA: Diagnosis not present

## 2017-07-27 DIAGNOSIS — N183 Chronic kidney disease, stage 3 (moderate): Secondary | ICD-10-CM | POA: Diagnosis not present

## 2017-07-27 DIAGNOSIS — G309 Alzheimer's disease, unspecified: Secondary | ICD-10-CM | POA: Diagnosis not present

## 2017-07-27 DIAGNOSIS — E039 Hypothyroidism, unspecified: Secondary | ICD-10-CM | POA: Diagnosis not present

## 2017-07-28 DIAGNOSIS — N183 Chronic kidney disease, stage 3 (moderate): Secondary | ICD-10-CM | POA: Diagnosis not present

## 2017-07-28 DIAGNOSIS — F209 Schizophrenia, unspecified: Secondary | ICD-10-CM | POA: Diagnosis not present

## 2017-07-28 DIAGNOSIS — G309 Alzheimer's disease, unspecified: Secondary | ICD-10-CM | POA: Diagnosis not present

## 2017-07-28 DIAGNOSIS — F4310 Post-traumatic stress disorder, unspecified: Secondary | ICD-10-CM | POA: Diagnosis not present

## 2017-07-28 DIAGNOSIS — E7800 Pure hypercholesterolemia, unspecified: Secondary | ICD-10-CM | POA: Diagnosis not present

## 2017-07-28 DIAGNOSIS — E039 Hypothyroidism, unspecified: Secondary | ICD-10-CM | POA: Diagnosis not present

## 2017-07-29 DIAGNOSIS — E7800 Pure hypercholesterolemia, unspecified: Secondary | ICD-10-CM | POA: Diagnosis not present

## 2017-07-29 DIAGNOSIS — F4310 Post-traumatic stress disorder, unspecified: Secondary | ICD-10-CM | POA: Diagnosis not present

## 2017-07-29 DIAGNOSIS — N183 Chronic kidney disease, stage 3 (moderate): Secondary | ICD-10-CM | POA: Diagnosis not present

## 2017-07-29 DIAGNOSIS — G309 Alzheimer's disease, unspecified: Secondary | ICD-10-CM | POA: Diagnosis not present

## 2017-07-29 DIAGNOSIS — E039 Hypothyroidism, unspecified: Secondary | ICD-10-CM | POA: Diagnosis not present

## 2017-07-29 DIAGNOSIS — F209 Schizophrenia, unspecified: Secondary | ICD-10-CM | POA: Diagnosis not present

## 2017-07-30 DIAGNOSIS — N183 Chronic kidney disease, stage 3 (moderate): Secondary | ICD-10-CM | POA: Diagnosis not present

## 2017-07-30 DIAGNOSIS — F209 Schizophrenia, unspecified: Secondary | ICD-10-CM | POA: Diagnosis not present

## 2017-07-30 DIAGNOSIS — F4310 Post-traumatic stress disorder, unspecified: Secondary | ICD-10-CM | POA: Diagnosis not present

## 2017-07-30 DIAGNOSIS — E039 Hypothyroidism, unspecified: Secondary | ICD-10-CM | POA: Diagnosis not present

## 2017-07-30 DIAGNOSIS — G309 Alzheimer's disease, unspecified: Secondary | ICD-10-CM | POA: Diagnosis not present

## 2017-07-30 DIAGNOSIS — E7800 Pure hypercholesterolemia, unspecified: Secondary | ICD-10-CM | POA: Diagnosis not present

## 2017-07-31 DIAGNOSIS — I10 Essential (primary) hypertension: Secondary | ICD-10-CM | POA: Diagnosis not present

## 2017-07-31 DIAGNOSIS — N183 Chronic kidney disease, stage 3 (moderate): Secondary | ICD-10-CM | POA: Diagnosis not present

## 2017-07-31 DIAGNOSIS — E559 Vitamin D deficiency, unspecified: Secondary | ICD-10-CM | POA: Diagnosis not present

## 2017-07-31 DIAGNOSIS — E7800 Pure hypercholesterolemia, unspecified: Secondary | ICD-10-CM | POA: Diagnosis not present

## 2017-07-31 DIAGNOSIS — D509 Iron deficiency anemia, unspecified: Secondary | ICD-10-CM | POA: Diagnosis not present

## 2017-07-31 DIAGNOSIS — F209 Schizophrenia, unspecified: Secondary | ICD-10-CM | POA: Diagnosis not present

## 2017-07-31 DIAGNOSIS — G309 Alzheimer's disease, unspecified: Secondary | ICD-10-CM | POA: Diagnosis not present

## 2017-07-31 DIAGNOSIS — E039 Hypothyroidism, unspecified: Secondary | ICD-10-CM | POA: Diagnosis not present

## 2017-07-31 DIAGNOSIS — F4310 Post-traumatic stress disorder, unspecified: Secondary | ICD-10-CM | POA: Diagnosis not present

## 2017-07-31 DIAGNOSIS — E538 Deficiency of other specified B group vitamins: Secondary | ICD-10-CM | POA: Diagnosis not present

## 2017-08-02 DIAGNOSIS — F209 Schizophrenia, unspecified: Secondary | ICD-10-CM | POA: Diagnosis not present

## 2017-08-02 DIAGNOSIS — G309 Alzheimer's disease, unspecified: Secondary | ICD-10-CM | POA: Diagnosis not present

## 2017-08-02 DIAGNOSIS — E7800 Pure hypercholesterolemia, unspecified: Secondary | ICD-10-CM | POA: Diagnosis not present

## 2017-08-02 DIAGNOSIS — F4310 Post-traumatic stress disorder, unspecified: Secondary | ICD-10-CM | POA: Diagnosis not present

## 2017-08-02 DIAGNOSIS — N183 Chronic kidney disease, stage 3 (moderate): Secondary | ICD-10-CM | POA: Diagnosis not present

## 2017-08-02 DIAGNOSIS — E039 Hypothyroidism, unspecified: Secondary | ICD-10-CM | POA: Diagnosis not present

## 2017-08-03 DIAGNOSIS — G309 Alzheimer's disease, unspecified: Secondary | ICD-10-CM | POA: Diagnosis not present

## 2017-08-03 DIAGNOSIS — N183 Chronic kidney disease, stage 3 (moderate): Secondary | ICD-10-CM | POA: Diagnosis not present

## 2017-08-03 DIAGNOSIS — F4310 Post-traumatic stress disorder, unspecified: Secondary | ICD-10-CM | POA: Diagnosis not present

## 2017-08-03 DIAGNOSIS — E039 Hypothyroidism, unspecified: Secondary | ICD-10-CM | POA: Diagnosis not present

## 2017-08-03 DIAGNOSIS — E7800 Pure hypercholesterolemia, unspecified: Secondary | ICD-10-CM | POA: Diagnosis not present

## 2017-08-03 DIAGNOSIS — F209 Schizophrenia, unspecified: Secondary | ICD-10-CM | POA: Diagnosis not present

## 2017-08-04 DIAGNOSIS — F4310 Post-traumatic stress disorder, unspecified: Secondary | ICD-10-CM | POA: Diagnosis not present

## 2017-08-04 DIAGNOSIS — E039 Hypothyroidism, unspecified: Secondary | ICD-10-CM | POA: Diagnosis not present

## 2017-08-04 DIAGNOSIS — N183 Chronic kidney disease, stage 3 (moderate): Secondary | ICD-10-CM | POA: Diagnosis not present

## 2017-08-04 DIAGNOSIS — E7800 Pure hypercholesterolemia, unspecified: Secondary | ICD-10-CM | POA: Diagnosis not present

## 2017-08-04 DIAGNOSIS — F209 Schizophrenia, unspecified: Secondary | ICD-10-CM | POA: Diagnosis not present

## 2017-08-04 DIAGNOSIS — G309 Alzheimer's disease, unspecified: Secondary | ICD-10-CM | POA: Diagnosis not present

## 2017-08-05 DIAGNOSIS — E039 Hypothyroidism, unspecified: Secondary | ICD-10-CM | POA: Diagnosis not present

## 2017-08-05 DIAGNOSIS — F209 Schizophrenia, unspecified: Secondary | ICD-10-CM | POA: Diagnosis not present

## 2017-08-05 DIAGNOSIS — G309 Alzheimer's disease, unspecified: Secondary | ICD-10-CM | POA: Diagnosis not present

## 2017-08-05 DIAGNOSIS — F4310 Post-traumatic stress disorder, unspecified: Secondary | ICD-10-CM | POA: Diagnosis not present

## 2017-08-05 DIAGNOSIS — N183 Chronic kidney disease, stage 3 (moderate): Secondary | ICD-10-CM | POA: Diagnosis not present

## 2017-08-05 DIAGNOSIS — E7800 Pure hypercholesterolemia, unspecified: Secondary | ICD-10-CM | POA: Diagnosis not present

## 2017-08-06 DIAGNOSIS — F209 Schizophrenia, unspecified: Secondary | ICD-10-CM | POA: Diagnosis not present

## 2017-08-06 DIAGNOSIS — E039 Hypothyroidism, unspecified: Secondary | ICD-10-CM | POA: Diagnosis not present

## 2017-08-06 DIAGNOSIS — E7800 Pure hypercholesterolemia, unspecified: Secondary | ICD-10-CM | POA: Diagnosis not present

## 2017-08-06 DIAGNOSIS — G309 Alzheimer's disease, unspecified: Secondary | ICD-10-CM | POA: Diagnosis not present

## 2017-08-06 DIAGNOSIS — F4310 Post-traumatic stress disorder, unspecified: Secondary | ICD-10-CM | POA: Diagnosis not present

## 2017-08-06 DIAGNOSIS — N183 Chronic kidney disease, stage 3 (moderate): Secondary | ICD-10-CM | POA: Diagnosis not present

## 2017-08-08 DIAGNOSIS — E7800 Pure hypercholesterolemia, unspecified: Secondary | ICD-10-CM | POA: Diagnosis not present

## 2017-08-08 DIAGNOSIS — F4310 Post-traumatic stress disorder, unspecified: Secondary | ICD-10-CM | POA: Diagnosis not present

## 2017-08-08 DIAGNOSIS — N183 Chronic kidney disease, stage 3 (moderate): Secondary | ICD-10-CM | POA: Diagnosis not present

## 2017-08-08 DIAGNOSIS — F209 Schizophrenia, unspecified: Secondary | ICD-10-CM | POA: Diagnosis not present

## 2017-08-08 DIAGNOSIS — E039 Hypothyroidism, unspecified: Secondary | ICD-10-CM | POA: Diagnosis not present

## 2017-08-08 DIAGNOSIS — G309 Alzheimer's disease, unspecified: Secondary | ICD-10-CM | POA: Diagnosis not present

## 2017-08-09 DIAGNOSIS — E7800 Pure hypercholesterolemia, unspecified: Secondary | ICD-10-CM | POA: Diagnosis not present

## 2017-08-09 DIAGNOSIS — G309 Alzheimer's disease, unspecified: Secondary | ICD-10-CM | POA: Diagnosis not present

## 2017-08-09 DIAGNOSIS — N183 Chronic kidney disease, stage 3 (moderate): Secondary | ICD-10-CM | POA: Diagnosis not present

## 2017-08-09 DIAGNOSIS — F4310 Post-traumatic stress disorder, unspecified: Secondary | ICD-10-CM | POA: Diagnosis not present

## 2017-08-09 DIAGNOSIS — E039 Hypothyroidism, unspecified: Secondary | ICD-10-CM | POA: Diagnosis not present

## 2017-08-09 DIAGNOSIS — F209 Schizophrenia, unspecified: Secondary | ICD-10-CM | POA: Diagnosis not present

## 2017-08-10 DIAGNOSIS — F4310 Post-traumatic stress disorder, unspecified: Secondary | ICD-10-CM | POA: Diagnosis not present

## 2017-08-10 DIAGNOSIS — G309 Alzheimer's disease, unspecified: Secondary | ICD-10-CM | POA: Diagnosis not present

## 2017-08-10 DIAGNOSIS — F209 Schizophrenia, unspecified: Secondary | ICD-10-CM | POA: Diagnosis not present

## 2017-08-10 DIAGNOSIS — E039 Hypothyroidism, unspecified: Secondary | ICD-10-CM | POA: Diagnosis not present

## 2017-08-10 DIAGNOSIS — N183 Chronic kidney disease, stage 3 (moderate): Secondary | ICD-10-CM | POA: Diagnosis not present

## 2017-08-10 DIAGNOSIS — E7800 Pure hypercholesterolemia, unspecified: Secondary | ICD-10-CM | POA: Diagnosis not present

## 2017-08-11 DIAGNOSIS — F209 Schizophrenia, unspecified: Secondary | ICD-10-CM | POA: Diagnosis not present

## 2017-08-11 DIAGNOSIS — N183 Chronic kidney disease, stage 3 (moderate): Secondary | ICD-10-CM | POA: Diagnosis not present

## 2017-08-11 DIAGNOSIS — E039 Hypothyroidism, unspecified: Secondary | ICD-10-CM | POA: Diagnosis not present

## 2017-08-11 DIAGNOSIS — F4310 Post-traumatic stress disorder, unspecified: Secondary | ICD-10-CM | POA: Diagnosis not present

## 2017-08-11 DIAGNOSIS — E7800 Pure hypercholesterolemia, unspecified: Secondary | ICD-10-CM | POA: Diagnosis not present

## 2017-08-11 DIAGNOSIS — G309 Alzheimer's disease, unspecified: Secondary | ICD-10-CM | POA: Diagnosis not present

## 2017-08-12 DIAGNOSIS — E039 Hypothyroidism, unspecified: Secondary | ICD-10-CM | POA: Diagnosis not present

## 2017-08-12 DIAGNOSIS — N183 Chronic kidney disease, stage 3 (moderate): Secondary | ICD-10-CM | POA: Diagnosis not present

## 2017-08-12 DIAGNOSIS — F209 Schizophrenia, unspecified: Secondary | ICD-10-CM | POA: Diagnosis not present

## 2017-08-12 DIAGNOSIS — F4310 Post-traumatic stress disorder, unspecified: Secondary | ICD-10-CM | POA: Diagnosis not present

## 2017-08-12 DIAGNOSIS — E7800 Pure hypercholesterolemia, unspecified: Secondary | ICD-10-CM | POA: Diagnosis not present

## 2017-08-12 DIAGNOSIS — G309 Alzheimer's disease, unspecified: Secondary | ICD-10-CM | POA: Diagnosis not present

## 2017-08-13 DIAGNOSIS — N183 Chronic kidney disease, stage 3 (moderate): Secondary | ICD-10-CM | POA: Diagnosis not present

## 2017-08-13 DIAGNOSIS — E7800 Pure hypercholesterolemia, unspecified: Secondary | ICD-10-CM | POA: Diagnosis not present

## 2017-08-13 DIAGNOSIS — E039 Hypothyroidism, unspecified: Secondary | ICD-10-CM | POA: Diagnosis not present

## 2017-08-13 DIAGNOSIS — F4310 Post-traumatic stress disorder, unspecified: Secondary | ICD-10-CM | POA: Diagnosis not present

## 2017-08-13 DIAGNOSIS — G309 Alzheimer's disease, unspecified: Secondary | ICD-10-CM | POA: Diagnosis not present

## 2017-08-13 DIAGNOSIS — F209 Schizophrenia, unspecified: Secondary | ICD-10-CM | POA: Diagnosis not present

## 2017-08-16 DIAGNOSIS — G309 Alzheimer's disease, unspecified: Secondary | ICD-10-CM | POA: Diagnosis not present

## 2017-08-16 DIAGNOSIS — N183 Chronic kidney disease, stage 3 (moderate): Secondary | ICD-10-CM | POA: Diagnosis not present

## 2017-08-16 DIAGNOSIS — E039 Hypothyroidism, unspecified: Secondary | ICD-10-CM | POA: Diagnosis not present

## 2017-08-16 DIAGNOSIS — E7800 Pure hypercholesterolemia, unspecified: Secondary | ICD-10-CM | POA: Diagnosis not present

## 2017-08-16 DIAGNOSIS — F4310 Post-traumatic stress disorder, unspecified: Secondary | ICD-10-CM | POA: Diagnosis not present

## 2017-08-16 DIAGNOSIS — F209 Schizophrenia, unspecified: Secondary | ICD-10-CM | POA: Diagnosis not present

## 2017-08-17 DIAGNOSIS — E7800 Pure hypercholesterolemia, unspecified: Secondary | ICD-10-CM | POA: Diagnosis not present

## 2017-08-17 DIAGNOSIS — F4310 Post-traumatic stress disorder, unspecified: Secondary | ICD-10-CM | POA: Diagnosis not present

## 2017-08-17 DIAGNOSIS — F209 Schizophrenia, unspecified: Secondary | ICD-10-CM | POA: Diagnosis not present

## 2017-08-17 DIAGNOSIS — E039 Hypothyroidism, unspecified: Secondary | ICD-10-CM | POA: Diagnosis not present

## 2017-08-17 DIAGNOSIS — N183 Chronic kidney disease, stage 3 (moderate): Secondary | ICD-10-CM | POA: Diagnosis not present

## 2017-08-17 DIAGNOSIS — G309 Alzheimer's disease, unspecified: Secondary | ICD-10-CM | POA: Diagnosis not present

## 2017-08-18 DIAGNOSIS — G309 Alzheimer's disease, unspecified: Secondary | ICD-10-CM | POA: Diagnosis not present

## 2017-08-18 DIAGNOSIS — F4310 Post-traumatic stress disorder, unspecified: Secondary | ICD-10-CM | POA: Diagnosis not present

## 2017-08-18 DIAGNOSIS — E7800 Pure hypercholesterolemia, unspecified: Secondary | ICD-10-CM | POA: Diagnosis not present

## 2017-08-18 DIAGNOSIS — F209 Schizophrenia, unspecified: Secondary | ICD-10-CM | POA: Diagnosis not present

## 2017-08-18 DIAGNOSIS — E039 Hypothyroidism, unspecified: Secondary | ICD-10-CM | POA: Diagnosis not present

## 2017-08-18 DIAGNOSIS — N183 Chronic kidney disease, stage 3 (moderate): Secondary | ICD-10-CM | POA: Diagnosis not present

## 2017-08-19 DIAGNOSIS — E7800 Pure hypercholesterolemia, unspecified: Secondary | ICD-10-CM | POA: Diagnosis not present

## 2017-08-19 DIAGNOSIS — F209 Schizophrenia, unspecified: Secondary | ICD-10-CM | POA: Diagnosis not present

## 2017-08-19 DIAGNOSIS — G309 Alzheimer's disease, unspecified: Secondary | ICD-10-CM | POA: Diagnosis not present

## 2017-08-19 DIAGNOSIS — F4310 Post-traumatic stress disorder, unspecified: Secondary | ICD-10-CM | POA: Diagnosis not present

## 2017-08-19 DIAGNOSIS — N183 Chronic kidney disease, stage 3 (moderate): Secondary | ICD-10-CM | POA: Diagnosis not present

## 2017-08-19 DIAGNOSIS — E039 Hypothyroidism, unspecified: Secondary | ICD-10-CM | POA: Diagnosis not present

## 2017-08-20 DIAGNOSIS — N183 Chronic kidney disease, stage 3 (moderate): Secondary | ICD-10-CM | POA: Diagnosis not present

## 2017-08-20 DIAGNOSIS — F4310 Post-traumatic stress disorder, unspecified: Secondary | ICD-10-CM | POA: Diagnosis not present

## 2017-08-20 DIAGNOSIS — K5900 Constipation, unspecified: Secondary | ICD-10-CM | POA: Diagnosis not present

## 2017-08-20 DIAGNOSIS — E039 Hypothyroidism, unspecified: Secondary | ICD-10-CM | POA: Diagnosis not present

## 2017-08-20 DIAGNOSIS — F209 Schizophrenia, unspecified: Secondary | ICD-10-CM | POA: Diagnosis not present

## 2017-08-20 DIAGNOSIS — E7800 Pure hypercholesterolemia, unspecified: Secondary | ICD-10-CM | POA: Diagnosis not present

## 2017-08-20 DIAGNOSIS — G309 Alzheimer's disease, unspecified: Secondary | ICD-10-CM | POA: Diagnosis not present

## 2017-08-23 DIAGNOSIS — E039 Hypothyroidism, unspecified: Secondary | ICD-10-CM | POA: Diagnosis not present

## 2017-08-23 DIAGNOSIS — F209 Schizophrenia, unspecified: Secondary | ICD-10-CM | POA: Diagnosis not present

## 2017-08-23 DIAGNOSIS — G309 Alzheimer's disease, unspecified: Secondary | ICD-10-CM | POA: Diagnosis not present

## 2017-08-23 DIAGNOSIS — E7800 Pure hypercholesterolemia, unspecified: Secondary | ICD-10-CM | POA: Diagnosis not present

## 2017-08-23 DIAGNOSIS — N183 Chronic kidney disease, stage 3 (moderate): Secondary | ICD-10-CM | POA: Diagnosis not present

## 2017-08-23 DIAGNOSIS — F4310 Post-traumatic stress disorder, unspecified: Secondary | ICD-10-CM | POA: Diagnosis not present

## 2017-08-24 DIAGNOSIS — G309 Alzheimer's disease, unspecified: Secondary | ICD-10-CM | POA: Diagnosis not present

## 2017-08-24 DIAGNOSIS — F4310 Post-traumatic stress disorder, unspecified: Secondary | ICD-10-CM | POA: Diagnosis not present

## 2017-08-24 DIAGNOSIS — N183 Chronic kidney disease, stage 3 (moderate): Secondary | ICD-10-CM | POA: Diagnosis not present

## 2017-08-24 DIAGNOSIS — E039 Hypothyroidism, unspecified: Secondary | ICD-10-CM | POA: Diagnosis not present

## 2017-08-24 DIAGNOSIS — E7800 Pure hypercholesterolemia, unspecified: Secondary | ICD-10-CM | POA: Diagnosis not present

## 2017-08-24 DIAGNOSIS — F209 Schizophrenia, unspecified: Secondary | ICD-10-CM | POA: Diagnosis not present

## 2017-08-25 DIAGNOSIS — N183 Chronic kidney disease, stage 3 (moderate): Secondary | ICD-10-CM | POA: Diagnosis not present

## 2017-08-25 DIAGNOSIS — F209 Schizophrenia, unspecified: Secondary | ICD-10-CM | POA: Diagnosis not present

## 2017-08-25 DIAGNOSIS — E039 Hypothyroidism, unspecified: Secondary | ICD-10-CM | POA: Diagnosis not present

## 2017-08-25 DIAGNOSIS — G309 Alzheimer's disease, unspecified: Secondary | ICD-10-CM | POA: Diagnosis not present

## 2017-08-25 DIAGNOSIS — E7800 Pure hypercholesterolemia, unspecified: Secondary | ICD-10-CM | POA: Diagnosis not present

## 2017-08-25 DIAGNOSIS — F4310 Post-traumatic stress disorder, unspecified: Secondary | ICD-10-CM | POA: Diagnosis not present

## 2017-08-26 DIAGNOSIS — E7800 Pure hypercholesterolemia, unspecified: Secondary | ICD-10-CM | POA: Diagnosis not present

## 2017-08-26 DIAGNOSIS — E039 Hypothyroidism, unspecified: Secondary | ICD-10-CM | POA: Diagnosis not present

## 2017-08-26 DIAGNOSIS — G309 Alzheimer's disease, unspecified: Secondary | ICD-10-CM | POA: Diagnosis not present

## 2017-08-26 DIAGNOSIS — F209 Schizophrenia, unspecified: Secondary | ICD-10-CM | POA: Diagnosis not present

## 2017-08-26 DIAGNOSIS — N183 Chronic kidney disease, stage 3 (moderate): Secondary | ICD-10-CM | POA: Diagnosis not present

## 2017-08-26 DIAGNOSIS — F4310 Post-traumatic stress disorder, unspecified: Secondary | ICD-10-CM | POA: Diagnosis not present

## 2017-08-27 DIAGNOSIS — E7800 Pure hypercholesterolemia, unspecified: Secondary | ICD-10-CM | POA: Diagnosis not present

## 2017-08-27 DIAGNOSIS — N183 Chronic kidney disease, stage 3 (moderate): Secondary | ICD-10-CM | POA: Diagnosis not present

## 2017-08-27 DIAGNOSIS — F209 Schizophrenia, unspecified: Secondary | ICD-10-CM | POA: Diagnosis not present

## 2017-08-27 DIAGNOSIS — E039 Hypothyroidism, unspecified: Secondary | ICD-10-CM | POA: Diagnosis not present

## 2017-08-27 DIAGNOSIS — G309 Alzheimer's disease, unspecified: Secondary | ICD-10-CM | POA: Diagnosis not present

## 2017-08-27 DIAGNOSIS — F4310 Post-traumatic stress disorder, unspecified: Secondary | ICD-10-CM | POA: Diagnosis not present

## 2017-08-28 DIAGNOSIS — N183 Chronic kidney disease, stage 3 (moderate): Secondary | ICD-10-CM | POA: Diagnosis not present

## 2017-08-28 DIAGNOSIS — E7800 Pure hypercholesterolemia, unspecified: Secondary | ICD-10-CM | POA: Diagnosis not present

## 2017-08-28 DIAGNOSIS — E039 Hypothyroidism, unspecified: Secondary | ICD-10-CM | POA: Diagnosis not present

## 2017-08-28 DIAGNOSIS — F4310 Post-traumatic stress disorder, unspecified: Secondary | ICD-10-CM | POA: Diagnosis not present

## 2017-08-28 DIAGNOSIS — G309 Alzheimer's disease, unspecified: Secondary | ICD-10-CM | POA: Diagnosis not present

## 2017-08-28 DIAGNOSIS — F209 Schizophrenia, unspecified: Secondary | ICD-10-CM | POA: Diagnosis not present

## 2017-08-30 DIAGNOSIS — G309 Alzheimer's disease, unspecified: Secondary | ICD-10-CM | POA: Diagnosis not present

## 2017-08-30 DIAGNOSIS — E538 Deficiency of other specified B group vitamins: Secondary | ICD-10-CM | POA: Diagnosis not present

## 2017-08-30 DIAGNOSIS — N183 Chronic kidney disease, stage 3 (moderate): Secondary | ICD-10-CM | POA: Diagnosis not present

## 2017-08-30 DIAGNOSIS — D509 Iron deficiency anemia, unspecified: Secondary | ICD-10-CM | POA: Diagnosis not present

## 2017-08-30 DIAGNOSIS — E559 Vitamin D deficiency, unspecified: Secondary | ICD-10-CM | POA: Diagnosis not present

## 2017-08-30 DIAGNOSIS — F4310 Post-traumatic stress disorder, unspecified: Secondary | ICD-10-CM | POA: Diagnosis not present

## 2017-08-30 DIAGNOSIS — I10 Essential (primary) hypertension: Secondary | ICD-10-CM | POA: Diagnosis not present

## 2017-08-30 DIAGNOSIS — E7800 Pure hypercholesterolemia, unspecified: Secondary | ICD-10-CM | POA: Diagnosis not present

## 2017-08-30 DIAGNOSIS — E039 Hypothyroidism, unspecified: Secondary | ICD-10-CM | POA: Diagnosis not present

## 2017-08-30 DIAGNOSIS — F209 Schizophrenia, unspecified: Secondary | ICD-10-CM | POA: Diagnosis not present

## 2017-08-31 DIAGNOSIS — E039 Hypothyroidism, unspecified: Secondary | ICD-10-CM | POA: Diagnosis not present

## 2017-08-31 DIAGNOSIS — F4310 Post-traumatic stress disorder, unspecified: Secondary | ICD-10-CM | POA: Diagnosis not present

## 2017-08-31 DIAGNOSIS — F209 Schizophrenia, unspecified: Secondary | ICD-10-CM | POA: Diagnosis not present

## 2017-08-31 DIAGNOSIS — N183 Chronic kidney disease, stage 3 (moderate): Secondary | ICD-10-CM | POA: Diagnosis not present

## 2017-08-31 DIAGNOSIS — G309 Alzheimer's disease, unspecified: Secondary | ICD-10-CM | POA: Diagnosis not present

## 2017-08-31 DIAGNOSIS — E7800 Pure hypercholesterolemia, unspecified: Secondary | ICD-10-CM | POA: Diagnosis not present

## 2017-09-01 DIAGNOSIS — F4310 Post-traumatic stress disorder, unspecified: Secondary | ICD-10-CM | POA: Diagnosis not present

## 2017-09-01 DIAGNOSIS — E039 Hypothyroidism, unspecified: Secondary | ICD-10-CM | POA: Diagnosis not present

## 2017-09-01 DIAGNOSIS — F209 Schizophrenia, unspecified: Secondary | ICD-10-CM | POA: Diagnosis not present

## 2017-09-01 DIAGNOSIS — N183 Chronic kidney disease, stage 3 (moderate): Secondary | ICD-10-CM | POA: Diagnosis not present

## 2017-09-01 DIAGNOSIS — G309 Alzheimer's disease, unspecified: Secondary | ICD-10-CM | POA: Diagnosis not present

## 2017-09-01 DIAGNOSIS — E7800 Pure hypercholesterolemia, unspecified: Secondary | ICD-10-CM | POA: Diagnosis not present

## 2017-09-30 DEATH — deceased

## 2018-03-08 IMAGING — DX DG CHEST 2V
2 series · 2 of 2 positions shown · non-contrast
Comparison: None.

CLINICAL DATA: Cough

EXAM:
CHEST  2 VIEW

[x chest ap]
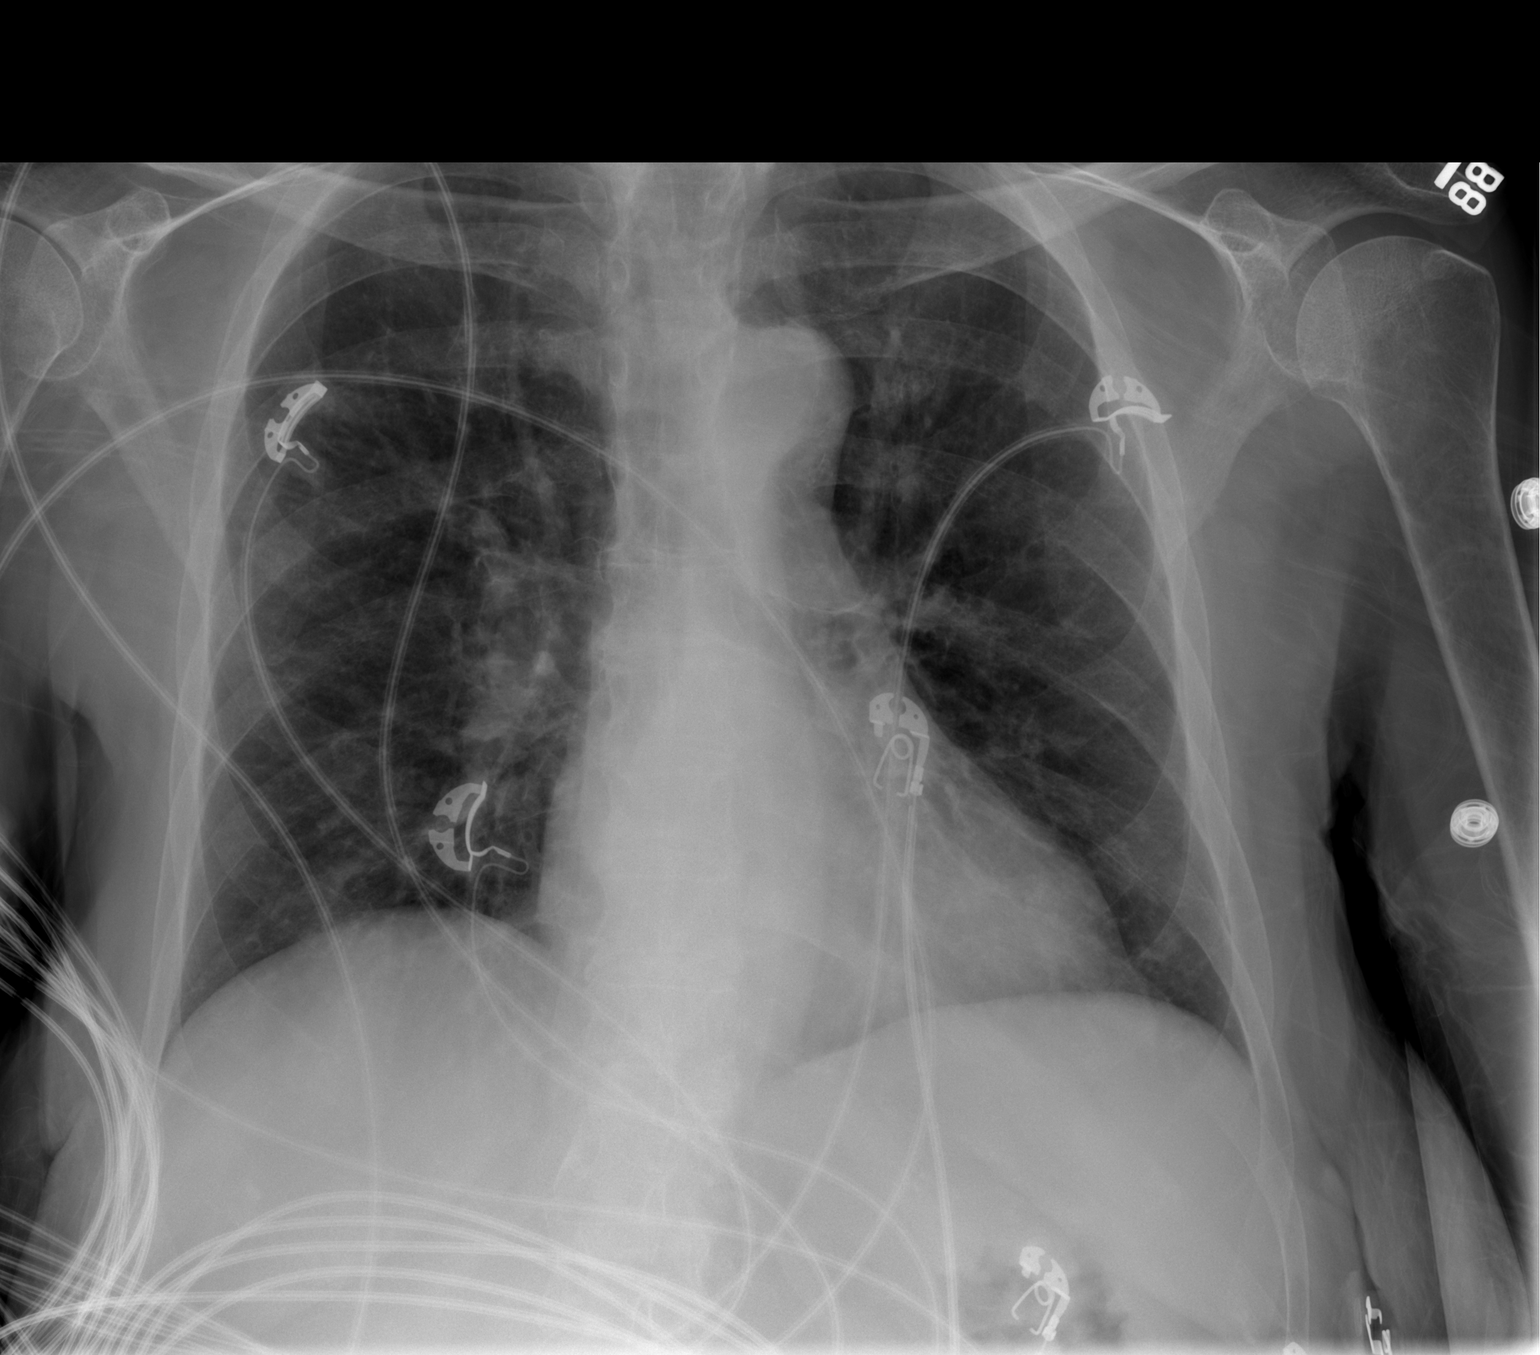

[w chest lat]
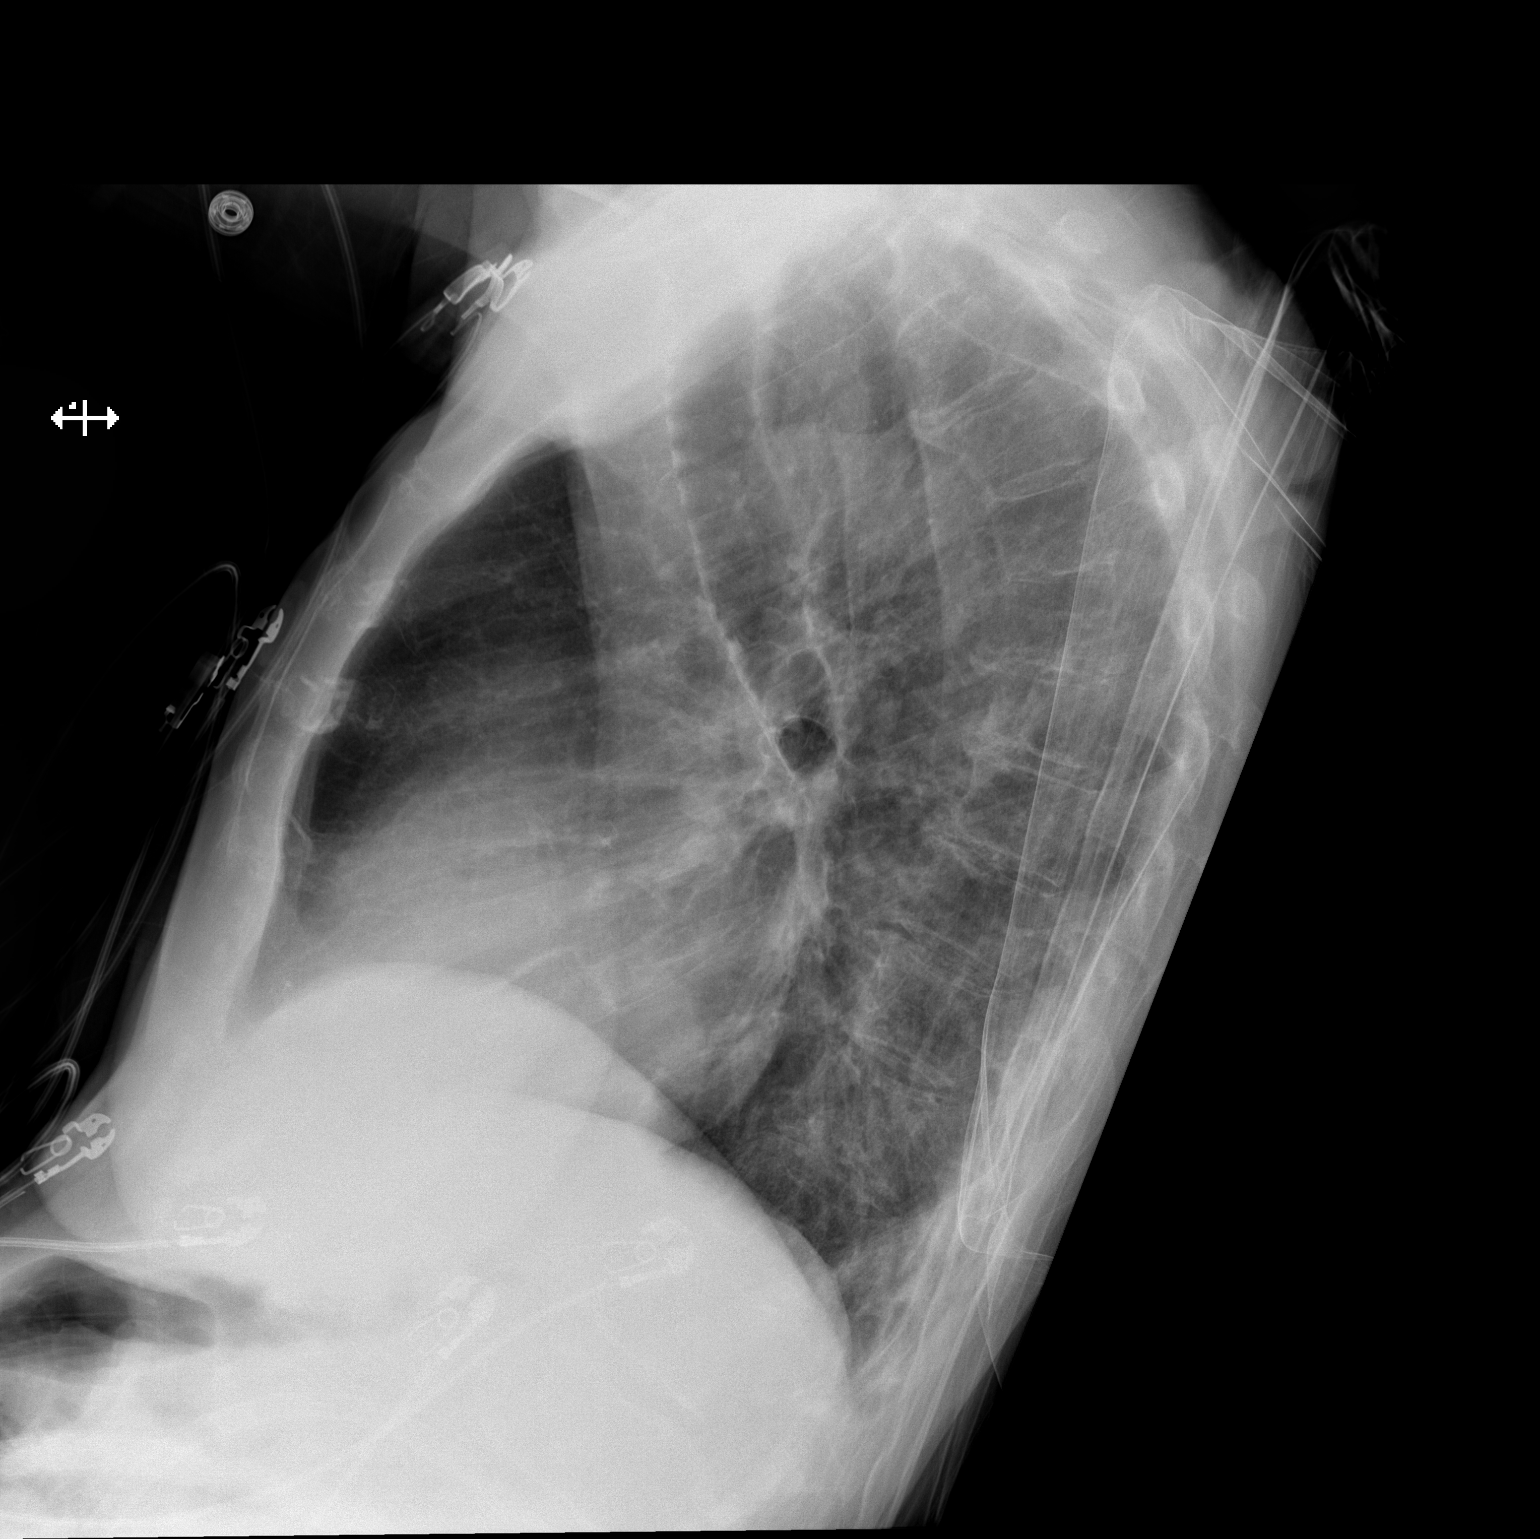

[2 of 2 positions shown; findings below may reference images not displayed]

FINDINGS: The heart size and mediastinal contours are within normal limits.
Both lungs are clear. Multilevel upper thoracic vertebral body
height loss with increased kyphosis.
IMPRESSION: No active cardiopulmonary disease.
